# Patient Record
Sex: Female | Born: 2000 | Race: White | Hispanic: No | Marital: Single | Smoking: Never smoker
Health system: Southern US, Community
[De-identification: ages and names within clinical notes are randomized; demographics above are authoritative.]

## PROBLEM LIST (undated history)

## (undated) DIAGNOSIS — K219 Gastro-esophageal reflux disease without esophagitis: Secondary | ICD-10-CM

---
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
## 2020-01-08 ENCOUNTER — Ambulatory Visit (INDEPENDENT_AMBULATORY_CARE_PROVIDER_SITE_OTHER): Payer: 59 | Admitting: Physical Therapy

## 2020-01-08 ENCOUNTER — Other Ambulatory Visit: Payer: Self-pay

## 2020-01-08 ENCOUNTER — Encounter: Payer: Self-pay | Admitting: Physical Therapy

## 2020-01-08 DIAGNOSIS — M545 Low back pain, unspecified: Secondary | ICD-10-CM

## 2020-01-08 DIAGNOSIS — R29898 Other symptoms and signs involving the musculoskeletal system: Secondary | ICD-10-CM

## 2020-01-08 DIAGNOSIS — R293 Abnormal posture: Secondary | ICD-10-CM

## 2020-01-08 DIAGNOSIS — M6281 Muscle weakness (generalized): Secondary | ICD-10-CM

## 2020-01-08 NOTE — Therapy (Signed)
[facility] [address] [place] [address] [place], [place], [address] Phone: [phone]   Fax:  [phone]  Physical Therapy Treatment  Patient Details  Name: Laura Pineda MRN: [id number] Date of Birth: [birth year] Referring Provider (PT): Dr Benjamin Stain   Encounter Date: 01/08/2020   PT End of Session - 01/08/20 1958    Visit Number 2    Number of Visits 12    Date for PT Re-Evaluation 02/09/20    PT Start Time 1620    PT Stop Time 1700    PT Time Calculation (min) 40 min    Activity Tolerance Patient tolerated treatment well    Behavior During Therapy [facility] for tasks assessed/performed           Past Medical History:  Diagnosis Date  . GERD (gastroesophageal reflux disease)     Past Surgical History:  Procedure Laterality Date  . CHOLECYSTECTOMY  01/15/2018    There were no vitals filed for this visit.   Subjective Assessment - 01/08/20 1621    Subjective Pt reports no new changes since last visit.  She states she has beeen trying to sit up taller, and doing HEP 1x/ day.    Currently in Pain? Yes    Pain Score 5     Pain Location Back    Pain Orientation Lower    Aggravating Factors  prolonged sitting; heavy book bag    Pain Relieving Factors lying down; heat              [facility] PT Assessment - 01/08/20 0001      Assessment   Medical Diagnosis LBP     Referring Provider (PT) Dr Benjamin Stain    Onset Date/Surgical Date 11/16/19    Hand Dominance Right;Left    Next MD Visit 01/26/20    Prior Therapy none             [facility] Adult PT Treatment/Exercise - 01/08/20 0001      Self-Care   Self-Care Other Self-Care Comments    Other Self-Care Comments  Pt educated on self massage with ball to back musculature for pain relief; pt returned demo.       Lumbar Exercises: Stretches   Lower Trunk Rotation 3 reps;10 seconds    Lower Trunk Rotation Limitations and wide leg LTR.       Lumbar Exercises: Aerobic    Tread Mill 1.3-2.0 mph x 5 min       Lumbar Exercises: Standing   Row Strengthening;Both;10 reps    Theraband Level (Row) Level 3 (Green)      Lumbar Exercises: Seated   Sit to Stand 10 reps   slow eccentric stand to sit - elevated table d/t knee pain      Lumbar Exercises: Supine   Ab Set 10 reps;5 seconds    AB Set Limitations tactile / VC to engage- best response with lap press in hooklying     Bridge 10 reps;3 seconds      Lumbar Exercises: Sidelying   Other Sidelying Lumbar Exercises open book x 3 reps each side.       Lumbar Exercises: Prone   Opposite Arm/Leg Raise Right arm/Left leg;Left arm/Right leg;10 reps      Lumbar Exercises: Quadruped   Madcat/Old Horse 5 reps    Madcat/Old Horse Limitations wag the tail x 3 reps each direction.  PT Long Term Goals - 12/29/19 1320      PT LONG TERM GOAL #1   Title Improve posture and alignment with patient to demonstrate upright posture in standing and sitting    Time 6    Period Weeks    Status New    Target Date 02/09/20      PT LONG TERM GOAL #2   Title Increase trunk ROM to WNL's and painfree    Time 6    Period Weeks    Status New    Target Date 02/09/20      PT LONG TERM GOAL #3   Title Patient to report no more than 1-2/10 pain on the 0-10 pain scale with functional activities    Time 6    Period Weeks    Status New    Target Date 02/09/20      PT LONG TERM GOAL #4   Title Independent in HEP    Time 6    Period Weeks    Status New    Target Date 02/09/20      PT LONG TERM GOAL #5   Title Improve FOTO to </= 32% limitation    Time 6    Period Weeks    Status New    Target Date 02/09/20                 Plan - 01/08/20 1634    Clinical Impression Statement Mulitple cues required (tactile/VC) to engage TA properly.  No increase in LBP with exercises, and no reduction either.  Minor cues for upright posture when upright. Responds well to ext based exercises.  Pt not interested in use of estim during session (also, per pt, parent has TENS at home). Goals are ongoing.    Stability/Clinical Decision Making Stable/Uncomplicated    Rehab Potential Good    PT Frequency 2x / week    PT Duration 6 weeks    PT Treatment/Interventions ADLs/Self Care Home Management;Aquatic Therapy;Electrical Stimulation;Cryotherapy;Iontophoresis 4mg /ml Dexamethasone;Moist Heat;Ultrasound;Gait training;Stair training;Functional mobility training;Therapeutic activities;Therapeutic exercise;Balance training;Neuromuscular re-education;Patient/family education;Manual techniques;Dry needling;Taping    PT Next Visit Plan progress with ther ex - stretch pecs; improve thoracic extension; improve postural control/strength; modalities and manual work as indicated.    PT Home Exercise Plan    Recommended Other Services ( Pt has very sensitive skin; likely wouldn't tolerate kinesiology tape)    Consulted and Agree with Plan of Care Patient           Patient will benefit from skilled therapeutic intervention in order to improve the following deficits and impairments:  Decreased range of motion, Increased fascial restricitons, Pain, Decreased activity tolerance, Impaired flexibility, Improper body mechanics, Decreased mobility, Decreased strength, Postural dysfunction  Visit Diagnosis: Acute bilateral low back pain without sciatica  Abnormal posture  Muscle weakness (generalized)  Other symptoms and signs involving the musculoskeletal system     Problem List Patient Active Problem List   Diagnosis Date Noted  . Lumbarization, vertebra 12/12/2019  . Acute bilateral low back pain without sciatica 12/12/2019  . 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 Allergic reaction 08/13/2019  . Allergic urticaria 08/13/2019  . Chronic rhinitis 08/13/2019  . Breast hypertrophy in female 07/06/2019  . Insomnia due to other mental disorder 02/09/2019  . Irritable bowel syndrome  with both constipation and diarrhea 02/09/2019  . Anxiety 02/09/2019  . Weight gain 12/02/2018  . Mood swings 12/02/2018  . Post concussion syndrome 12/02/2018  . Pancreatitis, gallstone 01/14/2018  . Social anxiety  disorder 07/24/2017  . Multiple food allergies 05/28/2017  . Abdominal cramping 05/24/2017  . Bruising 05/24/2017  . Anhedonia 05/24/2017  . Moderate episode of recurrent major depressive disorder (HCC) 05/24/2017   Mayer Camel, PTA 01/08/20 6:00PM  [facility] [address] [place] [address] [place], [place], [address] Phone: [phone]   Fax:  [phone]  Name: Keidy Thurgood MRN: [id number] Date of Birth: 2000/04/05

## 2020-01-14 ENCOUNTER — Encounter: Payer: 59 | Admitting: Physical Therapy

## 2020-01-19 ENCOUNTER — Other Ambulatory Visit: Payer: Self-pay

## 2020-01-19 ENCOUNTER — Ambulatory Visit (INDEPENDENT_AMBULATORY_CARE_PROVIDER_SITE_OTHER): Payer: 59 | Admitting: Physical Therapy

## 2020-01-19 DIAGNOSIS — M6281 Muscle weakness (generalized): Secondary | ICD-10-CM

## 2020-01-19 DIAGNOSIS — M545 Low back pain, unspecified: Secondary | ICD-10-CM | POA: Diagnosis not present

## 2020-01-19 DIAGNOSIS — R293 Abnormal posture: Secondary | ICD-10-CM

## 2020-01-19 NOTE — Therapy (Signed)
[facility] [place] [address] [place] [address] [place], [place], [address] Phone: [phone]   Fax:  [phone]  Physical Therapy Treatment  Patient Details  Name: Laura Pineda MRN: [id number] Date of Birth: 2001-02-06 Referring Provider (PT): Dr Benjamin Stain   Encounter Date: 01/19/2020   PT End of Session - 01/19/20 1608    Visit Number 3    Number of Visits 12    Date for PT Re-Evaluation 02/09/20    PT Start Time 1604    PT Stop Time 1652    PT Time Calculation (min) 48 min    Activity Tolerance Patient tolerated treatment well    Behavior During Therapy [facility] for tasks assessed/performed           Past Medical History:  Diagnosis Date  . GERD (gastroesophageal reflux disease)     Past Surgical History:  Procedure Laterality Date  . CHOLECYSTECTOMY  01/15/2018    There were no vitals filed for this visit.   Subjective Assessment - 01/19/20 1608    Subjective Pt reports her back pain is sporadic; up to 7/10. She states she is doing her exercises 1x/day.  She reports that standing trunk ext after sitting, open book in sidelying, and squats all help her back.  She takes a meloxicam if pain doesn't ease up (taken twice in the last week).    Currently in Pain? No/denies    Pain Score 0-No pain              [facility] PT Assessment - 01/19/20 0001      Assessment   Medical Diagnosis LBP     Referring Provider (PT) Dr Benjamin Stain    Onset Date/Surgical Date 11/16/19    Hand Dominance Right;Left    Next MD Visit 01/26/20    Prior Therapy none       AROM   Lumbar - Right Side Bend WNL    Lumbar - Left Side Bend WNL    Lumbar - Right Rotation 50%    Lumbar - Left Rotation WNL            [facility] Adult PT Treatment/Exercise - 01/19/20 0001      Self-Care   Self-Care Posture    Posture Pt and pt's mother educated on importance of upright posture and avoiding prolonged flexed positions (ie: looking down at phone, or slumped  in school desk). Both verbalized understanding.       Lumbar Exercises: Aerobic   Tread Mill 2.0-2.3 mph x 5 min for warm up.       Lumbar Exercises: Standing   Row Strengthening;Both;10 reps    Theraband Level (Row) Level 3 (Green)    Row Limitations cues for form and to slow speed.     Other Standing Lumbar Exercises high plank to downward dog (modified with hands on elevated table) x 10 sec each pose x 3 reps; frequent cues on form of plank (hips too low, shoulders hiked).        Lumbar Exercises: Seated   Sit to Stand 5 reps   to black mat; good eccentric control     Lumbar Exercises: Supine   Single Leg Bridge 5 reps   2 sets, fig 4 position     Lumbar Exercises: Sidelying   Other Sidelying Lumbar Exercises open book x 2 reps each side; 5 reps with yellow band       Lumbar Exercises: Prone   Opposite Arm/Leg Raise Right arm/Left leg;Left arm/Right leg;10 reps  Other Prone Lumbar Exercises pelvic press x 5 sec x 3, then pelvic press with unilateral knee bends       Lumbar Exercises: Quadruped   Madcat/Old Horse 5 reps    Madcat/Old Horse Limitations wag the tail x 3 reps each direction.                        PT Long Term Goals - 12/29/19 1320      PT LONG TERM GOAL #1   Title Improve posture and alignment with patient to demonstrate upright posture in standing and sitting    Time 6    Period Weeks    Status New    Target Date 02/09/20      PT LONG TERM GOAL #2   Title Increase trunk ROM to WNL's and painfree    Time 6    Period Weeks    Status New    Target Date 02/09/20      PT LONG TERM GOAL #3   Title Patient to report no more than 1-2/10 pain on the 0-10 pain scale with functional activities    Time 6    Period Weeks    Status New    Target Date 02/09/20      PT LONG TERM GOAL #4   Title Independent in HEP    Time 6    Period Weeks    Status New    Target Date 02/09/20      PT LONG TERM GOAL #5   Title Improve FOTO to </= 32%  limitation    Time 6    Period Weeks    Status New    Target Date 02/09/20                 Plan - 01/19/20 1701    Clinical Impression Statement Pt reporting less frequency of back pain.  Pt required less frequent cues on upright posture, compared to last session.  Pt able to tolerate single leg bridge without pain or difficulty.  All exercises tolerated well.  Progressing towards goals.    Stability/Clinical Decision Making Stable/Uncomplicated    Rehab Potential Good    PT Frequency 2x / week    PT Duration 6 weeks    PT Treatment/Interventions ADLs/Self Care Home Management;Aquatic Therapy;Electrical Stimulation;Cryotherapy;Iontophoresis 4mg /ml Dexamethasone;Moist Heat;Ultrasound;Gait training;Stair training;Functional mobility training;Therapeutic activities;Therapeutic exercise;Balance training;Neuromuscular re-education;Patient/family education;Manual techniques;Dry needling;Taping    PT Next Visit Plan improve thoracic extension; improve postural control/strength; trial squat with kettle bell and core engaged.    PT Home Exercise Plan    Consulted and Agree with Plan of Care Patient           Patient will benefit from skilled therapeutic intervention in order to improve the following deficits and impairments:  Decreased range of motion, Increased fascial restricitons, Pain, Decreased activity tolerance, Impaired flexibility, Improper body mechanics, Decreased mobility, Decreased strength, Postural dysfunction  Visit Diagnosis: Acute bilateral low back pain without sciatica  Abnormal posture  Muscle weakness (generalized)     Problem List Patient Active Problem List   Diagnosis Date Noted  . Lumbarization, vertebra 12/12/2019  . Acute bilateral low back pain without sciatica 12/12/2019  . 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 Allergic reaction 08/13/2019  . Allergic urticaria 08/13/2019  . Chronic rhinitis 08/13/2019  . Breast hypertrophy in  female 07/06/2019  . Insomnia due to other mental disorder 02/09/2019  . Irritable bowel syndrome with both constipation and diarrhea 02/09/2019  .  Anxiety 02/09/2019  . Weight gain 12/02/2018  . Mood swings 12/02/2018  . Post concussion syndrome 12/02/2018  . Pancreatitis, gallstone 01/14/2018  . Social anxiety disorder 07/24/2017  . Multiple food allergies 05/28/2017  . Abdominal cramping 05/24/2017  . Bruising 05/24/2017  . Anhedonia 05/24/2017  . Moderate episode of recurrent major depressive disorder (HCC) 05/24/2017    Salvadore Oxford 01/19/2020, 5:03 PM  [facility] [address] [place] [address] [place], [place], [address] Phone: [phone]   Fax:  [phone]  Name: Laura Pineda MRN: [id number] Date of Birth: Jan 11, 2001

## 2020-01-22 ENCOUNTER — Ambulatory Visit (INDEPENDENT_AMBULATORY_CARE_PROVIDER_SITE_OTHER): Payer: 59 | Admitting: Rehabilitative and Restorative Service Providers"

## 2020-01-22 ENCOUNTER — Encounter: Payer: Self-pay | Admitting: Rehabilitative and Restorative Service Providers"

## 2020-01-22 ENCOUNTER — Other Ambulatory Visit: Payer: Self-pay

## 2020-01-22 DIAGNOSIS — M6281 Muscle weakness (generalized): Secondary | ICD-10-CM

## 2020-01-22 DIAGNOSIS — R29898 Other symptoms and signs involving the musculoskeletal system: Secondary | ICD-10-CM

## 2020-01-22 DIAGNOSIS — R293 Abnormal posture: Secondary | ICD-10-CM

## 2020-01-22 DIAGNOSIS — M545 Low back pain, unspecified: Secondary | ICD-10-CM | POA: Diagnosis not present

## 2020-01-22 NOTE — Patient Instructions (Signed)
Access Code: [email or web address]: [email or web address]: 11/11/2021Prepared by: Najat Olazabal HoltExercises  Supine Transversus Abdominis Bracing with Pelvic Floor Contraction - 2 x daily - 7 x weekly - 1 sets - 10 reps - 10sec hold  Bridge - 2 x daily - 7 x weekly - 1-2 sets - 10 reps - 5 sec hold  Hooklying Isometric Clamshell - 2 x daily - 7 x weekly - 1 sets - 10 reps - 3 sec hold  Sit to Stand - 2 x daily - 7 x weekly - 1 sets - 10 reps - 3-5 sec hold  Wall Quarter Squat - 2 x daily - 7 x weekly - 1-2 sets - 10 reps - 5-10 sec hold  Sidelying Thoracic Rotation with Open Book - 1 x daily - 7 x weekly - 1 sets - 3-5 reps  Prone Alternating Arm and Leg Lifts - 1 x daily - 7 x weekly - 1 sets - 10 reps  Cat-Camel to Child's Pose - 1 x daily - 7 x weekly - 1 sets - 5 reps  Shoulder Extension with Resistance - 2 x daily - 7 x weekly - 1-3 sets - 10 reps - 2-3 sec hold  Standing Plank on Wall - 2 x daily - 7 x weekly - 1 sets - 3 reps - 60 sec hold  Quadruped Alternating Arm Lift - 2 x daily - 7 x weekly - 1 sets - 10 reps - 3-5 sec hold  Quadruped Alternating Leg Extensions - 2 x daily - 7 x weekly - 1 sets - 10 reps - 3-5 sec hold

## 2020-01-22 NOTE — Therapy (Signed)
[facility] [place] [address] [place], [place], [address] Phone: [phone]   Fax:  [phone]  Physical Therapy Treatment  Patient Details  Name: Laura Pineda MRN: [id number] Date of Birth: 13-May-2000 Referring Provider (PT): Dr Benjamin Stain   Encounter Date: 01/22/2020   PT End of Session - 01/22/20 1614    Visit Number 4    Number of Visits 12    Date for PT Re-Evaluation 02/09/20    PT Start Time 1614    PT Stop Time 1659    PT Time Calculation (min) 45 min    Activity Tolerance Patient tolerated treatment well           Past Medical History:  Diagnosis Date  . GERD (gastroesophageal reflux disease)     Past Surgical History:  Procedure Laterality Date  . CHOLECYSTECTOMY  01/15/2018    There were no vitals filed for this visit.   Subjective Assessment - 01/22/20 1615    Subjective Patient reports increased back pain today. Did not have school so she has "just been chillin'" did a few of her exercises    Currently in Pain? Yes    Pain Score 5     Pain Location Back    Pain Orientation Lower    Pain Descriptors / Indicators Sharp    Pain Type Acute pain    Pain Radiating Towards up to mid back in the middle - no symptoms in LE's    Pain Frequency Intermittent    Aggravating Factors  prolonged sitting; heavy book bag    Pain Relieving Factors lying down; heat; exercise                             [facility] Adult PT Treatment/Exercise - 01/22/20 0001      Lumbar Exercises: Stretches   Other Lumbar Stretch Exercise thoracic extension lying supine with coregeous ball mid thoracic area x 2 min       Lumbar Exercises: Aerobic   Elliptical L 1.5 x 5 min end of session     Tread Mill 2.0-2.4 mph x 5 min for warm up      Lumbar Exercises: Standing   Row Strengthening;Both;10 reps   VC for posture    Theraband Level (Row) Level 3 (Green)    Row Limitations repeated holding shoulder tight and  stepping back 10 reps 3 sec hold     Shoulder Extension Strengthening;Both;10 reps;Theraband    Theraband Level (Shoulder Extension) Level 3 (Green)    Shoulder Extension Limitations bow and arrow green TB x 10 each UE     Other Standing Lumbar Exercises wall plank 60 sec x 3 reps       Lumbar Exercises: Seated   Sit to Stand 10 reps   to black mat; better eccentric control    Sit to Stand Limitations repeated with 5# KB x 10 reps       Lumbar Exercises: Sidelying   Other Sidelying Lumbar Exercises open book x10 reps each side - yellow band       Lumbar Exercises: Prone   Opposite Arm/Leg Raise Right arm/Left leg;Left arm/Right leg;10 reps      Lumbar Exercises: Quadruped   Madcat/Old Horse 5 reps    Single Arm Raise Right;Left;10 reps;2 seconds   alternating UE's    Straight Leg Raise 10 reps;2 seconds   alternating LE's  PT Education - 01/22/20 1701    Education Details HEP    Person(s) Educated Patient    Methods Explanation;Demonstration;Tactile cues;Verbal cues;Handout    Comprehension Verbalized understanding;Returned demonstration;Verbal cues required;Tactile cues required               PT Long Term Goals - 12/29/19 1320      PT LONG TERM GOAL #1   Title Improve posture and alignment with patient to demonstrate upright posture in standing and sitting    Time 6    Period Weeks    Status New    Target Date 02/09/20      PT LONG TERM GOAL #2   Title Increase trunk ROM to WNL's and painfree    Time 6    Period Weeks    Status New    Target Date 02/09/20      PT LONG TERM GOAL #3   Title Patient to report no more than 1-2/10 pain on the 0-10 pain scale with functional activities    Time 6    Period Weeks    Status New    Target Date 02/09/20      PT LONG TERM GOAL #4   Title Independent in HEP    Time 6    Period Weeks    Status New    Target Date 02/09/20      PT LONG TERM GOAL #5   Title Improve FOTO to </= 32% limitation     Time 6    Period Weeks    Status New    Target Date 02/09/20                 Plan - 01/22/20 1620    Clinical Impression Statement --           Patient will benefit from skilled therapeutic intervention in order to improve the following deficits and impairments:     Visit Diagnosis: Acute bilateral low back pain without sciatica  Abnormal posture  Muscle weakness (generalized)  Other symptoms and signs involving the musculoskeletal system     Problem List Patient Active Problem List   Diagnosis Date Noted  . Lumbarization, vertebra 12/12/2019  . Acute bilateral low back pain without sciatica 12/12/2019  . 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 Allergic reaction 08/13/2019  . Allergic urticaria 08/13/2019  . Chronic rhinitis 08/13/2019  . Breast hypertrophy in female 07/06/2019  . Insomnia due to other mental disorder 02/09/2019  . Irritable bowel syndrome with both constipation and diarrhea 02/09/2019  . Anxiety 02/09/2019  . Weight gain 12/02/2018  . Mood swings 12/02/2018  . Post concussion syndrome 12/02/2018  . Pancreatitis, gallstone 01/14/2018  . Social anxiety disorder 07/24/2017  . Multiple food allergies 05/28/2017  . Abdominal cramping 05/24/2017  . Bruising 05/24/2017  . Anhedonia 05/24/2017  . Moderate episode of recurrent major depressive disorder (HCC) 05/24/2017    Caci Orren Rober Minion PT, MPH  01/22/2020, 5:02 PM  [facility] [address] [place] [place], [place], [address] Phone: [phone]   Fax:  [phone]  Name: Laura Pineda MRN: [id number] Date of Birth: 02/12/2001

## 2020-01-26 ENCOUNTER — Ambulatory Visit: Payer: 59 | Admitting: Sports Medicine

## 2020-01-26 ENCOUNTER — Ambulatory Visit (INDEPENDENT_AMBULATORY_CARE_PROVIDER_SITE_OTHER): Payer: 59 | Admitting: Physical Therapy

## 2020-01-26 ENCOUNTER — Other Ambulatory Visit: Payer: Self-pay

## 2020-01-26 DIAGNOSIS — R293 Abnormal posture: Secondary | ICD-10-CM | POA: Diagnosis not present

## 2020-01-26 DIAGNOSIS — M6281 Muscle weakness (generalized): Secondary | ICD-10-CM

## 2020-01-26 DIAGNOSIS — M545 Low back pain, unspecified: Secondary | ICD-10-CM | POA: Diagnosis not present

## 2020-01-26 DIAGNOSIS — R29898 Other symptoms and signs involving the musculoskeletal system: Secondary | ICD-10-CM | POA: Diagnosis not present

## 2020-01-26 NOTE — Patient Instructions (Signed)

## 2020-01-26 NOTE — Therapy (Signed)
[place], [place], [address] Phone: [phone]   Fax:  [phone]  Physical Therapy Treatment  Patient Details  Name: Laura Pineda MRN: [id number] Date of Birth: [birth year] Referring Provider (PT): Dr Dianah Field   Encounter Date: 01/26/2020   PT End of Session - 01/26/20 1659    Visit Number 5    Number of Visits 12    Date for PT Re-Evaluation 02/09/20    PT Start Time 8768    PT Stop Time 1655    PT Time Calculation (min) 48 min    Activity Tolerance Patient tolerated treatment well    Behavior During Therapy [facility] for tasks assessed/performed           Past Medical History:  Diagnosis Date  . GERD (gastroesophageal reflux disease)     Past Surgical History:  Procedure Laterality Date  . CHOLECYSTECTOMY  01/15/2018    There were no vitals filed for this visit.   Subjective Assessment - 01/26/20 1607    Subjective Pt reports she went hiking Friday, then slept all weekend.  "Today is a bad day, everything is hurting".  "I'm very sluggish today".  Pt reports she feels she is sitting up straighter.  She also notes that she tried to pick something up off floor with weekend, and it really hurt her back (pt demonstrates bending over to touch floor with straight hips / knees)"    Currently in Pain? Yes    Pain Score 5     Pain Location Generalized    Pain Descriptors / Indicators Sore    Aggravating Factors  prolonged sitting    Pain Relieving Factors lying down, heat, after completing exercises.              [facility] PT Assessment - 01/26/20 0001      Assessment   Medical Diagnosis LBP     Referring Provider (PT) Dr Dianah Field    Onset Date/Surgical Date 11/16/19    Hand Dominance Right;Left    Next MD Visit 02/16/20    Prior Therapy none             [facility] Adult PT Treatment/Exercise - 01/26/20 0001      Lumbar Exercises: Stretches   Quad Stretch Right;Left;2 reps;30  seconds   prone with strap     Lumbar Exercises: Aerobic   Elliptical L1: 3.5 min for warm up.     Tread Mill 2.4-2.6 mph x 3 min - for continued warm up       Lumbar Exercises: Standing   Row Strengthening;Both;10 reps   VC for posture    Theraband Level (Row) Level 3 (Green)   3 sec hold in retraction    Shoulder Extension Strengthening;Both;10 reps;Theraband    Theraband Level (Shoulder Extension) Level 3 (Green)    Shoulder Extension Limitations bow and arrow green TB x 10 each UE     Other Standing Lumbar Exercises functional squat lifting 5# KB to 6" step x 10     Other Standing Lumbar Exercises wall plank 60 sec x 2 reps followed by reverse wall push up x 8 reps.       Lumbar Exercises: Seated   Sit to Stand 5 reps   to black mat; eccentric control      Lumbar Exercises: Sidelying   Other Sidelying Lumbar Exercises open book x 5 reps each side - green band       Lumbar Exercises: Prone   Opposite  Arm/Leg Raise Right arm/Left leg;Left arm/Right leg;10 reps      Lumbar Exercises: Quadruped   Madcat/Old Horse 5 reps                       PT Long Term Goals - 01/26/20 1625      PT LONG TERM GOAL #1   Title Improve posture and alignment with patient to demonstrate upright posture in standing and sitting    Time 6    Status On-going      PT LONG TERM GOAL #2   Title Increase trunk ROM to WNL's and painfree    Time 6    Period Weeks    Status Partially Met      PT LONG TERM GOAL #3   Title Patient to report no more than 1-2/10 pain on the 0-10 pain scale with functional activities    Time 6    Period Weeks    Status On-going      PT LONG TERM GOAL #4   Title Independent in HEP    Time 6    Period Weeks    Status On-going      PT LONG TERM GOAL #5   Title Improve FOTO to </= 32% limitation    Time 6    Period Weeks    Status On-going                 Plan - 01/26/20 1641    Clinical Impression Statement Pt reported decrease of pain from  5 to 3/10 at end of session. Improving endurance and eccentric control with squats. Encouraged use of good body mechanics with reaching to pick stuff up off floor (ie: with squat vs bending over with straight legs). Pt required some encouragement throughout session; also given encouragement for consistent completion of HEP at home to assist with reduction of pain and improvement in strength. Progressing gradually towards goals.    Rehab Potential Good    PT Frequency 2x / week    PT Duration 6 weeks    PT Treatment/Interventions ADLs/Self Care Home Management;Aquatic Therapy;Electrical Stimulation;Cryotherapy;Iontophoresis 66m/ml Dexamethasone;Moist Heat;Ultrasound;Gait training;Stair training;Functional mobility training;Therapeutic activities;Therapeutic exercise;Balance training;Neuromuscular re-education;Patient/family education;Manual techniques;Dry needling;Taping    PT Next Visit Plan improve thoracic extension; improve postural control/strength; trial of quadruped exercises over swiss ball to decreased pressure on knees    PT Home Exercise Plan [email or web address]   Consulted and Agree with Plan of Care Patient;Family member/caregiver    Family Member Consulted mom           Patient will benefit from skilled therapeutic intervention in order to improve the following deficits and impairments:  Decreased range of motion, Increased fascial restricitons, Pain, Decreased activity tolerance, Impaired flexibility, Improper body mechanics, Decreased mobility, Decreased strength, Postural dysfunction  Visit Diagnosis: Acute bilateral low back pain without sciatica  Abnormal posture  Muscle weakness (generalized)  Other symptoms and signs involving the musculoskeletal system     Problem List Patient Active Problem List   Diagnosis Date Noted  . Lumbarization, vertebra 12/12/2019  . Acute bilateral low back pain without sciatica 12/12/2019  . 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 Allergic  reaction 08/13/2019  . Allergic urticaria 08/13/2019  . Chronic rhinitis 08/13/2019  . Breast hypertrophy in female 07/06/2019  . Insomnia due to other mental disorder 02/09/2019  . Irritable bowel syndrome with both constipation and diarrhea 02/09/2019  . Anxiety 02/09/2019  . Weight gain 12/02/2018  . Mood swings 12/02/2018  .  Post concussion syndrome 12/02/2018  . Pancreatitis, gallstone 01/14/2018  . Social anxiety disorder 07/24/2017  . Multiple food allergies 05/28/2017  . Abdominal cramping 05/24/2017  . Bruising 05/24/2017  . Anhedonia 05/24/2017  . Moderate episode of recurrent major depressive disorder ([place]) 05/24/2017   Kerin Perna, PTA 01/26/20 [place] [facility] [place], [place], [address] Phone: [phone]   Fax:  [phone]  Name: Laura Pineda MRN: [id number] Date of Birth: 06-09-2000

## 2020-01-28 ENCOUNTER — Ambulatory Visit (INDEPENDENT_AMBULATORY_CARE_PROVIDER_SITE_OTHER): Payer: 59 | Admitting: Physical Therapy

## 2020-01-28 ENCOUNTER — Other Ambulatory Visit: Payer: Self-pay

## 2020-01-28 ENCOUNTER — Encounter: Payer: Self-pay | Admitting: Physical Therapy

## 2020-01-28 DIAGNOSIS — R293 Abnormal posture: Secondary | ICD-10-CM | POA: Diagnosis not present

## 2020-01-28 DIAGNOSIS — M6281 Muscle weakness (generalized): Secondary | ICD-10-CM | POA: Diagnosis not present

## 2020-01-28 DIAGNOSIS — M545 Low back pain, unspecified: Secondary | ICD-10-CM

## 2020-01-28 DIAGNOSIS — R29898 Other symptoms and signs involving the musculoskeletal system: Secondary | ICD-10-CM | POA: Diagnosis not present

## 2020-01-28 NOTE — Therapy (Signed)
[place], [place], [address] Phone: [phone]   Fax:  [phone]  Physical Therapy Treatment  Patient Details  Name: Laura Pineda MRN: [id number] Date of Birth: Sep 18, 2000 Referring Provider (PT): Dr Dianah Field   Encounter Date: 01/28/2020   PT End of Session - 01/28/20 1602    Number of Visits 12    Date for PT Re-Evaluation 02/09/20    PT Start Time 1520    PT Stop Time 1601    PT Time Calculation (min) 41 min    Activity Tolerance Patient tolerated treatment well    Behavior During Therapy [facility] for tasks assessed/performed           Past Medical History:  Diagnosis Date  . GERD (gastroesophageal reflux disease)     Past Surgical History:  Procedure Laterality Date  . CHOLECYSTECTOMY  01/15/2018    There were no vitals filed for this visit.   Subjective Assessment - 01/28/20 1525    Subjective Pt reports she had less pain yesterday.  However today in school, her LLE fell asleep in 3rd period. She reports she was able to relieve numbness by propping RLE under her Lt thigh.  "There's no pain in my leg, it just goes numb"    Currently in Pain? Yes    Pain Score 5     Pain Location Back    Pain Orientation Right;Left;Lower    Pain Descriptors / Indicators Sharp    Aggravating Factors  sitting in school, walking with bookbag    Pain Relieving Factors lying down, heat              [facility] PT Assessment - 01/28/20 0001      Assessment   Medical Diagnosis LBP     Referring Provider (PT) Dr Dianah Field    Onset Date/Surgical Date 11/16/19    Hand Dominance Right;Left    Next MD Visit 02/16/20    Prior Therapy none              [facility] Adult PT Treatment/Exercise - 01/28/20 0001      Lumbar Exercises: Stretches   Lower Trunk Rotation 3 reps   rocking back and forth, 3-5 sec hold each way.     Lumbar Exercises: Aerobic   Tread Mill 2.5 mph x 5 min       Lumbar  Exercises: Seated   Sit to Stand 5 reps   to black mat; eccentric control      Lumbar Exercises: Supine   Bent Knee Raise 10 reps   with TA engaged.    Dead Bug 10 reps;3 seconds   with core engaged.    Bridge 10 reps;4 seconds      Lumbar Exercises: Sidelying   Other Sidelying Lumbar Exercises modified side plank x 10 sec each side.       Lumbar Exercises: Prone   Opposite Arm/Leg Raise Right arm/Left leg;Left arm/Right leg;10 reps      Lumbar Exercises: Quadruped   Madcat/Old Horse 5 reps    Other Quadruped Lumbar Exercises primal push up x 5 sec, 2 reps (challenging, reports crunching in knees when lifting);  high plank x 10 sec        Modalities   Modalities --   deferred; will use at home.             PT Long Term Goals - 01/28/20 1536      PT LONG TERM GOAL #1   Title  Improve posture and alignment with patient to demonstrate upright posture in standing and sitting    Time 6    Status Partially Met      PT LONG TERM GOAL #2   Title Increase trunk ROM to WNL's and painfree    Time 6    Period Weeks    Status Partially Met      PT LONG TERM GOAL #3   Title Patient to report no more than 1-2/10 pain on the 0-10 pain scale with functional activities    Time 6    Period Weeks    Status On-going      PT LONG TERM GOAL #4   Title Independent in HEP    Time 6    Period Weeks    Status On-going      PT LONG TERM GOAL #5   Title Improve FOTO to </= 32% limitation    Time 6    Period Weeks    Status On-going                 Plan - 01/28/20 1536    Clinical Impression Statement Pt reporting occurance of numbness in entire LLE during school day; encouraged pt to get up from chair and stand every 30 min (with permission of teacher).  Pt was painfree when in hooklying position and reported reduction of pain in standing at end of session. Continued focus on spinal stabilization and core strengthening to progress towards LTGs.    Rehab Potential Good    PT  Frequency 2x / week    PT Duration 6 weeks    PT Treatment/Interventions ADLs/Self Care Home Management;Aquatic Therapy;Electrical Stimulation;Cryotherapy;Iontophoresis 66m/ml Dexamethasone;Moist Heat;Ultrasound;Gait training;Stair training;Functional mobility training;Therapeutic activities;Therapeutic exercise;Balance training;Neuromuscular re-education;Patient/family education;Manual techniques;Dry needling;Taping    PT Next Visit Plan improve thoracic extension; improve postural control/strength.    PT Home Exercise Plan [email or web address]          Patient will benefit from skilled therapeutic intervention in order to improve the following deficits and impairments:  Decreased range of motion, Increased fascial restricitons, Pain, Decreased activity tolerance, Impaired flexibility, Improper body mechanics, Decreased mobility, Decreased strength, Postural dysfunction  Visit Diagnosis: Acute bilateral low back pain without sciatica  Abnormal posture  Muscle weakness (generalized)  Other symptoms and signs involving the musculoskeletal system     Problem List Patient Active Problem List   Diagnosis Date Noted  . Lumbarization, vertebra 12/12/2019  . Acute bilateral low back pain without sciatica 12/12/2019  . 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 Allergic reaction 08/13/2019  . Allergic urticaria 08/13/2019  . Chronic rhinitis 08/13/2019  . Breast hypertrophy in female 07/06/2019  . Insomnia due to other mental disorder 02/09/2019  . Irritable bowel syndrome with both constipation and diarrhea 02/09/2019  . Anxiety 02/09/2019  . Weight gain 12/02/2018  . Mood swings 12/02/2018  . Post concussion syndrome 12/02/2018  . Pancreatitis, gallstone 01/14/2018  . Social anxiety disorder 07/24/2017  . Multiple food allergies 05/28/2017  . Abdominal cramping 05/24/2017  . Bruising 05/24/2017  . Anhedonia 05/24/2017  . Moderate episode of recurrent major depressive disorder ([place]  05/24/2017   JKerin Perna PTA 01/28/20 4:05 PM  [facility] [place] [address]Phone: [phone]  Fax:  [phone] Name: Laura SeidenbergMRN: [id number] of Birth: 112/18/2002

## 2020-02-02 ENCOUNTER — Encounter: Payer: 59 | Admitting: Physical Therapy

## 2020-02-11 ENCOUNTER — Ambulatory Visit (INDEPENDENT_AMBULATORY_CARE_PROVIDER_SITE_OTHER): Payer: 59 | Admitting: Physical Therapy

## 2020-02-11 ENCOUNTER — Other Ambulatory Visit: Payer: Self-pay

## 2020-02-11 DIAGNOSIS — M545 Low back pain, unspecified: Secondary | ICD-10-CM | POA: Diagnosis not present

## 2020-02-11 DIAGNOSIS — M6281 Muscle weakness (generalized): Secondary | ICD-10-CM | POA: Diagnosis not present

## 2020-02-11 DIAGNOSIS — R293 Abnormal posture: Secondary | ICD-10-CM

## 2020-02-11 DIAGNOSIS — R29898 Other symptoms and signs involving the musculoskeletal system: Secondary | ICD-10-CM | POA: Diagnosis not present

## 2020-02-11 NOTE — Patient Instructions (Addendum)
Access Code: [email or web address] URL: [email or web address] Date: 12/01/2021Prepared by: [facility] [place]Exercises  Sit to Stand - 2 x daily - 7 x weekly - 1 sets - 10 reps - 3-5 sec hold  Prone Alternating Arm and Leg Lifts - 1 x daily - 7 x weekly - 1 sets - 10 reps  Cat-Camel to Child's Pose - 1 x daily - 7 x weekly - 1 sets - 5 reps  Sidelying Thoracic Rotation with Open Book - 1 x daily - 7 x weekly - 1 sets - 3-5 reps  Standing Shoulder Row with Anchored Resistance - 1 x daily - 7 x weekly - 2 sets - 10 reps - 3 sec hold  Shoulder Extension with Resistance - 1 x daily - 7 x weekly - 1 sets - 10 reps - 2-3 sec hold  Anti-Rotation Lateral Stepping with Press - 1 x daily - 3 x weekly - 1 sets - 5-10 reps  Figure 4 Bridge - 1 x daily - 7 x weekly - 1 sets - 10 reps

## 2020-02-11 NOTE — Addendum Note (Signed)
Addended by: Margretta Ditty M on: 02/11/2020 11:20 PM   Modules accepted: Orders

## 2020-02-11 NOTE — Therapy (Addendum)
[place], [place], [address] Phone: [phone]   Fax:  [phone]  Physical Therapy Treatment/ [facility] Note/ Discharge Summary  Patient Details  Name: Laura Pineda MRN: [id number] Date of Birth: 2000-12-01 Referring Provider (PT): Dr Dianah Field   Encounter Date: 02/11/2020   PT End of Session - 02/11/20 1614    Visit Number 7    Number of Visits 12    Date for PT Re-Evaluation 02/09/20    PT Start Time 1601    PT Stop Time 1650    PT Time Calculation (min) 49 min    Activity Tolerance Patient tolerated treatment well    Behavior During Therapy [facility] for tasks assessed/performed           Past Medical History:  Diagnosis Date  . GERD (gastroesophageal reflux disease)     Past Surgical History:  Procedure Laterality Date  . CHOLECYSTECTOMY  01/15/2018    There were no vitals filed for this visit.   Subjective Assessment - 02/11/20 1615    Subjective Pt reports her knees have been bothering her the last few days.  She states that PT has "helped a little" with her LBP.  She is performing 4 exercises of HEP every other day. She is limited from doing chores due like dishes due to LBP with prolonged forward flexion.   She is taking meloxicam when needed (when pain is 5/10).  Her mother states that she will be limited with her ability to take daughter to appts after today.    Currently in Pain? Yes    Pain Score 3     Pain Location Back    Pain Orientation Right;Left;Lower    Pain Descriptors / Indicators Dull;Aching    Aggravating Factors  walking with bookbag, sitting at school, leaning over sink for dishes    Pain Relieving Factors lying down, heat              [facility] PT Assessment - 02/11/20 0001      Assessment   Medical Diagnosis LBP     Referring Provider (PT) Dr Dianah Field    Onset Date/Surgical Date 11/16/19    Hand Dominance Right;Left    Next MD Visit 02/16/20    Prior  Therapy none       Observation/Other Assessments   Focus on Therapeutic Outcomes (FOTO)  30% limited      AROM   Lumbar Flexion WNL    Lumbar Extension WNL    Lumbar - Right Side Bend WNL    Lumbar - Left Side Bend WNL    Lumbar - Right Rotation WNL   tightness reported   Lumbar - Left Rotation WNL            [facility] Adult PT Treatment/Exercise - 02/11/20 0001      Lumbar Exercises: Stretches   Other Lumbar Stretch Exercise open book x 3 reps each side     Other Lumbar Stretch Exercise midlevel doorway stretch x 20 sec x 2 reps      Lumbar Exercises: Aerobic   Tread Mill 2.8 mph x 5 min       Lumbar Exercises: Standing   Row Strengthening;Both;10 reps   3 sec hold in retraction   Shoulder Extension Strengthening;Both;5 reps    Theraband Level (Shoulder Extension) Level 4 (Blue)    Other Standing Lumbar Exercises anti-rotation with side step (blue band) x 5 reps each side.     Other Standing Lumbar Exercises  wall plank x 30 sec (cues for form), 5 wall push ups.   reciprocal steps (no UE support) 6" x 12       Lumbar Exercises: Seated   Sit to Stand 10 reps   to regular table, eccentric lowering     Lumbar Exercises: Prone   Opposite Arm/Leg Raise Right arm/Left leg;Left arm/Right leg;10 reps      Lumbar Exercises: Quadruped   Madcat/Old Horse 5 reps                  PT Education - 02/11/20 1653    Education Details HEP    Person(s) Educated Patient;Parent(s)    Methods Explanation;Handout;Demonstration;Verbal cues    Comprehension Verbalized understanding;Returned demonstration               PT Long Term Goals - 02/11/20 1630      PT LONG TERM GOAL #1   Title Improve posture and alignment with patient to demonstrate upright posture in standing and sitting    Baseline minor cues required, but much improved.    Time 6    Status Partially Met      PT LONG TERM GOAL #2   Title Increase trunk ROM to WNL's and painfree    Time 6    Period Weeks     Status Achieved      PT LONG TERM GOAL #3   Title Patient to report no more than 1-2/10 pain on the 0-10 pain scale with functional activities    Baseline Pain up to 5/10, 2/10 at lowest.    Time 6    Period Weeks    Status Not Met      PT LONG TERM GOAL #4   Title Independent in HEP    Time 6    Period Weeks    Status Partially Met      PT LONG TERM GOAL #5   Title Improve FOTO to </= 32% limitation    Time 6    Period Weeks    Status Achieved                  Plan - 02/11/20 1637    Clinical Impression Statement Good improvement in posture, with less cues during session.  She tolerated increased speed on treadmill and increased resistance with core strengthening exercises, without increase in LBP.  Her lumbar ROM has improved and is painfree; some tightness reported with Rt rotation. Pt has partially met her goals.   In speaking with pt and her mother, they would like to hold therapy until MD appt.  Limited ability to attend sessions in future due to transportation and upcoming school graduation.    Rehab Potential Good    PT Frequency 2x / week    PT Duration 6 weeks    PT Treatment/Interventions ADLs/Self Care Home Management;Aquatic Therapy;Electrical Stimulation;Cryotherapy;Iontophoresis 4mg /ml Dexamethasone;Moist Heat;Ultrasound;Gait training;Stair training;Functional mobility training;Therapeutic activities;Therapeutic exercise;Balance training;Neuromuscular re-education;Patient/family education;Manual techniques;Dry needling;Taping    PT Next Visit Plan spoke to supervising PT; will hold therapy 30 days; if pt doesnt' return by 03/13/20, will d/c.    PT [place] and Agree with Plan of Care Patient;Family member/caregiver    Family Member Consulted mom           Patient will benefit from skilled therapeutic intervention in order to improve the following deficits and impairments:  Decreased range of motion, Increased fascial  restricitons, Pain, Decreased activity tolerance, Impaired flexibility, Improper body mechanics,  Decreased mobility, Decreased strength, Postural dysfunction  Visit Diagnosis: Acute bilateral low back pain without sciatica  Abnormal posture  Muscle weakness (generalized)  Other symptoms and signs involving the musculoskeletal system     Problem List Patient Active Problem List   Diagnosis Date Noted  . Lumbarization, vertebra 12/12/2019  . Acute bilateral low back pain without sciatica 12/12/2019  . 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 Allergic reaction 08/13/2019  . Allergic urticaria 08/13/2019  . Chronic rhinitis 08/13/2019  . Breast hypertrophy in female 07/06/2019  . Insomnia due to other mental disorder 02/09/2019  . Irritable bowel syndrome with both constipation and diarrhea 02/09/2019  . Anxiety 02/09/2019  . Weight gain 12/02/2018  . Mood swings 12/02/2018  . Post concussion syndrome 12/02/2018  . Pancreatitis, gallstone 01/14/2018  . Social anxiety disorder 07/24/2017  . Multiple food allergies 05/28/2017  . Abdominal cramping 05/24/2017  . Bruising 05/24/2017  . Anhedonia 05/24/2017  . Moderate episode of recurrent major depressive disorder ([place]) 05/24/2017   PHYSICAL THERAPY DISCHARGE SUMMARY  Visits from Start of Care: 7  Current functional level related to goals / functional outcomes: See goals above   Remaining deficits: See note above for patient status   Education / Equipment: Home program  Plan: Patient agrees to discharge.  Patient goals were partially met. Patient is being discharged due to meeting the stated rehab goals.  ?????        Thank you for the referral of this patient. Rudell Cobb, MPT   WEAVER,CHRISTINA, [place], PTA 02/11/20 5:00 PM  [place], [place], [address] Phone: [phone]   Fax:   [phone]  Name: Laura Pineda MRN: [id number] Date of Birth: 11-24-2000

## 2020-02-16 ENCOUNTER — Ambulatory Visit (INDEPENDENT_AMBULATORY_CARE_PROVIDER_SITE_OTHER): Payer: 59 | Admitting: Sports Medicine

## 2020-02-16 DIAGNOSIS — M545 Low back pain, unspecified: Secondary | ICD-10-CM

## 2020-02-16 MED ORDER — GABAPENTIN 300 MG PO CAPS: ORAL_CAPSULE | ORAL | 3 refills | Status: DC

## 2020-02-16 NOTE — Assessment & Plan Note (Signed)
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 At this point due to persistent pain we are going to proceed with an MRI of the lumbar spine, gabapentin at night, meloxicam as needed, they are going to use Tylenol for some breakthrough pain, I am happy to add tramadol if needed, return to see me for MRI results.

## 2020-02-16 NOTE — Progress Notes (Signed)
    Procedures performed today:    None.  Independent interpretation of notes and tests performed by another provider:   None.  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 At this point due to persistent pain we are going to proceed with an MRI of the lumbar spine, gabapentin at night, meloxicam as needed, they are going to use Tylenol for some breakthrough pain, I am happy to add tramadol if needed, return to see me for MRI results.    ___________________________________________ Ihor Austin. Benjamin Stain, M.D., ABFM., CAQSM. Primary Care and Sports Medicine [facility]  Adjunct Instructor of Family Medicine  [employer]

## 2020-02-21 ENCOUNTER — Other Ambulatory Visit: Payer: Self-pay

## 2020-02-21 ENCOUNTER — Ambulatory Visit (INDEPENDENT_AMBULATORY_CARE_PROVIDER_SITE_OTHER): Payer: 59

## 2020-02-21 DIAGNOSIS — M545 Low back pain, unspecified: Secondary | ICD-10-CM

## 2020-03-11 ENCOUNTER — Other Ambulatory Visit: Payer: Self-pay | Admitting: Physician Assistant

## 2020-04-07 ENCOUNTER — Other Ambulatory Visit: Payer: Self-pay | Admitting: Sports Medicine

## 2020-04-07 DIAGNOSIS — M545 Low back pain, unspecified: Secondary | ICD-10-CM

## 2020-08-13 ENCOUNTER — Other Ambulatory Visit: Payer: Self-pay | Admitting: Sports Medicine

## 2020-08-13 DIAGNOSIS — M545 Low back pain, unspecified: Secondary | ICD-10-CM

## 2020-10-30 ENCOUNTER — Other Ambulatory Visit: Payer: Self-pay | Admitting: Physician Assistant

## 2021-01-25 ENCOUNTER — Telehealth (INDEPENDENT_AMBULATORY_CARE_PROVIDER_SITE_OTHER): Payer: 59 | Admitting: Physician Assistant

## 2021-01-25 ENCOUNTER — Encounter: Payer: Self-pay | Admitting: Physician Assistant

## 2021-01-25 DIAGNOSIS — J0140 Acute pansinusitis, unspecified: Secondary | ICD-10-CM | POA: Diagnosis not present

## 2021-01-25 MED ORDER — METHYLPREDNISOLONE 4 MG PO TBPK: ORAL_TABLET | ORAL | 0 refills | Status: DC

## 2021-01-25 MED ORDER — AZITHROMYCIN 250 MG PO TABS: ORAL_TABLET | ORAL | 0 refills | Status: DC

## 2021-01-25 NOTE — Progress Notes (Signed)
..Virtual Visit via Video Note  I connected with Laura Pineda on 01/25/21 at  4:20 PM EST by a video enabled telemedicine application and verified that I am speaking with the correct person using two identifiers.  Location: Patient: home Provider: clinic  .Marland KitchenParticipating in visit:  Patient: Laura Pineda Provider: Tandy Gaw PA-C Provider in training: Asher Muir PA-C   I discussed the limitations of evaluation and management by telemedicine and the availability of in person appointments. The patient expressed understanding and agreed to proceed.  History of Present Illness: Patient is a 20 year old female who presents to the clinic to discuss 3 weeks of cough, congestion, sinus pressure.  She has been taking over-the-counter Mucinex, cough drops, Tylenol, ibuprofen.  she initially thought it was allergies but it has not gone away.  She is blowing and coughing up green to yellow mucus.  She denies any fever, chills, body aches, shortness of breath.  Her mother is also sick with similar symptoms.  She did take a negative COVID test initially but has been 3 weeks.  .. Active Ambulatory Problems    Diagnosis Date Noted   Abdominal cramping 05/24/2017   Bruising 05/24/2017   Anhedonia 05/24/2017   Moderate episode of recurrent major depressive disorder (HCC) 05/24/2017   Multiple food allergies 05/28/2017   Social anxiety disorder 07/24/2017   Weight gain 12/02/2018   Mood swings 12/02/2018   Post concussion syndrome 12/02/2018   Insomnia due to other mental disorder 02/09/2019   Irritable bowel syndrome with both constipation and diarrhea 02/09/2019   Anxiety 02/09/2019   Breast hypertrophy in female 07/06/2019   Pancreatitis, gallstone 01/14/2018   Allergic reaction 08/13/2019   Allergic urticaria 08/13/2019   Chronic rhinitis 08/13/2019   Lumbarization, vertebra 12/12/2019   Acute bilateral low back pain without sciatica 12/12/2019   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  No labored breathing or wheezing  .Marland Kitchen Active Ambulatory Problems    Diagnosis Date Noted   Abdominal cramping 05/24/2017   Bruising 05/24/2017   Anhedonia 05/24/2017   Moderate episode of recurrent major depressive disorder (HCC) 05/24/2017   Multiple food allergies 05/28/2017   Social anxiety disorder 07/24/2017   Weight gain 12/02/2018   Mood swings 12/02/2018   Post concussion syndrome 12/02/2018   Insomnia due to other mental disorder 02/09/2019   Irritable bowel syndrome with both constipation and diarrhea 02/09/2019   Anxiety 02/09/2019   Breast hypertrophy in female 07/06/2019   Pancreatitis, gallstone 01/14/2018   Allergic reaction 08/13/2019   Allergic urticaria 08/13/2019   Chronic rhinitis 08/13/2019   Lumbarization, vertebra 12/12/2019   Acute bilateral low back pain without sciatica 12/12/2019   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  Diagnoses and all orders for this visit:  Acute non-recurrent pansinusitis -     azithromycin (ZITHROMAX Z-PAK) 250 MG tablet; Take 2 tablets (500 mg) on  Day 1,  followed by 1 tablet (250 mg) once daily on Days 2 through 5. -     methylPREDNISolone (MEDROL DOSEPAK)  4 MG TBPK tablet; Take as directed by package insert.  3 weeks of sinus symptoms.  Treated with zpak due to PCN allergy and medrol dose pack.  Delsym for cough.  Follow up as needed or if symptoms persist.     Follow Up Instructions:    I discussed the assessment  and treatment plan with the patient. The patient was provided an opportunity to ask questions and all were answered. The patient agreed with the plan and demonstrated an understanding of the instructions.   The patient was advised to call back or seek an in-person evaluation if the symptoms worsen or if the condition fails to improve as anticipated.   Tandy Gaw, PA-C

## 2021-01-31 ENCOUNTER — Other Ambulatory Visit: Payer: Self-pay | Admitting: Sports Medicine

## 2021-01-31 DIAGNOSIS — M545 Low back pain, unspecified: Secondary | ICD-10-CM

## 2021-02-07 ENCOUNTER — Telehealth (INDEPENDENT_AMBULATORY_CARE_PROVIDER_SITE_OTHER): Payer: 59 | Admitting: Physician Assistant

## 2021-02-07 ENCOUNTER — Encounter: Payer: Self-pay | Admitting: Physician Assistant

## 2021-02-07 VITALS — Ht 68.0 in | Wt 180.0 lb

## 2021-02-07 DIAGNOSIS — R051 Acute cough: Secondary | ICD-10-CM

## 2021-02-07 DIAGNOSIS — J3089 Other allergic rhinitis: Secondary | ICD-10-CM

## 2021-02-07 MED ORDER — CLARITIN-D 24 HOUR 10-240 MG PO TB24: 1.0000 | ORAL_TABLET | Freq: Every day | ORAL | 0 refills | Status: DC

## 2021-02-07 NOTE — Progress Notes (Signed)
..  Virtual Visit via Video Note  I connected with Laura Pineda on 02/08/21 at  3:00 PM EST by a video enabled telemedicine application and verified that I am speaking with the correct person using two identifiers.  Location: Patient: home Provider: clinic  .Marland KitchenParticipating in visit:  Patient: Laura Pineda Provider: Tandy Gaw PA-C Provider in training: Asher Muir PA-S   I discussed the limitations of evaluation and management by telemedicine and the availability of in person appointments. The patient expressed understanding and agreed to proceed.  History of Present Illness: Pt is a 20 yo female who calls into the clinic to discuss cough, itching eyes, running nose that started a few days after she finished the zpak and medrol dose pack. She was feeling better. No fever, chills, body aches. Not taken covid test. No other sick contacts.   .. Active Ambulatory Problems    Diagnosis Date Noted   Abdominal cramping 05/24/2017   Bruising 05/24/2017   Anhedonia 05/24/2017   Moderate episode of recurrent major depressive disorder (HCC) 05/24/2017   Multiple food allergies 05/28/2017   Social anxiety disorder 07/24/2017   Weight gain 12/02/2018   Mood swings 12/02/2018   Post concussion syndrome 12/02/2018   Insomnia due to other mental disorder 02/09/2019   Irritable bowel syndrome with both constipation and diarrhea 02/09/2019   Anxiety 02/09/2019   Breast hypertrophy in female 07/06/2019   Pancreatitis, gallstone 01/14/2018   Allergic reaction 08/13/2019   Allergic urticaria 08/13/2019   Chronic rhinitis 08/13/2019   Lumbarization, vertebra 12/12/2019   Acute bilateral low back pain without sciatica 12/12/2019   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    Assessment and Plan: . Laura Pineda was seen today for acute visit, sore throat and migraine.  Diagnoses and all orders for this visit:  Non-seasonal allergic rhinitis, unspecified trigger -     loratadine-pseudoephedrine (CLARITIN-D 24 HOUR) 10-240 MG 24 hr tablet; Take 1 tablet by mouth daily.  Acute cough -     loratadine-pseudoephedrine (CLARITIN-D 24 HOUR) 10-240 MG 24 hr tablet; Take 1 tablet by mouth daily.  Allergies vs another URI.  Rest and watch for worsening symptoms for today and tomorrow. Ok to go back to school Wednesday.  Start claritin D and flonase.  If no fever or worsening symptoms not contagious.     Follow Up Instructions:    I discussed the assessment and treatment plan with the patient. The patient was provided an opportunity to ask questions and all were answered. The patient agreed with the plan and demonstrated an understanding of the instructions.   The patient was advised to call back or seek an in-person evaluation if the symptoms worsen or if the condition fails to improve as anticipated.   Sunday, PA-C

## 2021-02-08 DIAGNOSIS — R051 Acute cough: Secondary | ICD-10-CM | POA: Insufficient documentation

## 2021-02-08 DIAGNOSIS — J3089 Other allergic rhinitis: Secondary | ICD-10-CM | POA: Insufficient documentation

## 2021-02-11 ENCOUNTER — Encounter: Payer: Self-pay | Admitting: Physician Assistant

## 2021-04-14 ENCOUNTER — Other Ambulatory Visit: Payer: Self-pay | Admitting: Sports Medicine

## 2021-04-14 DIAGNOSIS — M545 Low back pain, unspecified: Secondary | ICD-10-CM

## 2021-05-09 ENCOUNTER — Other Ambulatory Visit: Payer: Self-pay

## 2021-05-19 ENCOUNTER — Other Ambulatory Visit: Payer: Self-pay

## 2021-05-19 ENCOUNTER — Emergency Department (INDEPENDENT_AMBULATORY_CARE_PROVIDER_SITE_OTHER)
Admission: EM | Admit: 2021-05-19 | Discharge: 2021-05-19 | Disposition: A | Discharge status: Home / Self Care | Payer: 59 | Source: Home / Self Care | Attending: Family Medicine | Admitting: Family Medicine

## 2021-05-19 DIAGNOSIS — M25532 Pain in left wrist: Secondary | ICD-10-CM

## 2021-05-19 DIAGNOSIS — M778 Other enthesopathies, not elsewhere classified: Secondary | ICD-10-CM

## 2021-05-19 MED ORDER — PREDNISONE 10 MG (21) PO TBPK: ORAL_TABLET | Freq: Every day | ORAL | 0 refills | Status: DC

## 2021-05-19 NOTE — ED Provider Notes (Signed)
?[place] ? ? ? ?CSN: [id number] ?Arrival date & time: 05/19/21  1808 ? ? ?  ? ?History   ?Chief Complaint ?Chief Complaint  ?Patient presents with  ? Wrist Pain  ? ? ?HPI ?Laura Pineda is a 21 y.o. female.  ? ?HPI ? ?Works in a Teacher, adult education.  Uses both hands a lot.  Has left hand and wrist pain for the last couple of days for no good reason.  No trauma.  Does not feel like she is done anything different as far as overuse.  Has had some swelling.  Pain is mostly in the long finger. ? ?Past Medical History:  ?Diagnosis Date  ? GERD (gastroesophageal reflux disease)   ? ? ?Patient Active Problem List  ? Diagnosis Date Noted  ? Non-seasonal allergic rhinitis 02/08/2021  ? Acute cough 02/08/2021  ? Lumbarization, vertebra 12/12/2019  ? Acute bilateral low back pain without sciatica 12/12/2019  ? 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 Allergic reaction 08/13/2019  ? Allergic urticaria 08/13/2019  ? Chronic rhinitis 08/13/2019  ? Breast hypertrophy in female 07/06/2019  ? Insomnia due to other mental disorder 02/09/2019  ? Irritable bowel syndrome with both constipation and diarrhea 02/09/2019  ? Anxiety 02/09/2019  ? Weight gain 12/02/2018  ? Mood swings 12/02/2018  ? Post concussion syndrome 12/02/2018  ? Pancreatitis, gallstone 01/14/2018  ? Social anxiety disorder 07/24/2017  ? Multiple food allergies 05/28/2017  ? Abdominal cramping 05/24/2017  ? Bruising 05/24/2017  ? Anhedonia 05/24/2017  ? Moderate episode of recurrent major depressive disorder ([place]) 05/24/2017  ? ? ?Past Surgical History:  ?Procedure Laterality Date  ? CHOLECYSTECTOMY  01/15/2018  ? ? ?OB History   ?No obstetric history on file. ?  ? ? ? ?Home Medications   ? ?Prior to Admission medications   ?Medication Sig Start Date End Date Taking? Authorizing Provider  ?predniSONE (STERAPRED UNI-PAK 21 TAB) 10 MG (21) TBPK tablet Take by mouth daily. tad 05/19/21  Yes Raylene Everts, MD  ?EPINEPHrine (AUVI-Q) 0.3 mg/0.3 mL IJ SOAJ injection Inject  0.3 mLs (0.3 mg total) into the muscle as needed for anaphylaxis. 08/13/19   Bobbitt, [place] Muta, MD  ?fluticasone [facility] 50 MCG/ACT nasal spray SPRAY 2 SPRAYS INTO EACH NOSTRIL EVERY DAY 11/01/20   Breeback, Jade L, PA-C  ?loratadine-pseudoephedrine (CLARITIN-D 24 HOUR) 10-240 MG 24 hr tablet Take 1 tablet by mouth daily. 02/07/21   Breeback, Jade L, PA-C  ?meloxicam (MOBIC) 15 MG tablet TAKE 1 TABLET BY MOUTH IN THE MORNING WITH A MEAL FOR 2 WEEKS, THEN 1 DAILY AS NEEDED FOR PAIN 01/31/21   Silverio Decamp, MD  ? ? ?Family History ?Family History  ?Problem Relation Age of Onset  ? Hyperlipidemia Mother   ? Allergic rhinitis Mother   ? Allergic rhinitis Father   ? Sinusitis Father   ? Bipolar disorder Brother   ? Asthma Maternal Grandfather   ? Urticaria Neg Hx   ? Eczema Neg Hx   ? Immunodeficiency Neg Hx   ? Angioedema Neg Hx   ? Atopy Neg Hx   ? ? ?Social History ?Social History  ? ?Tobacco Use  ? Smoking status: Never  ? Smokeless tobacco: Never  ?Vaping Use  ? Vaping Use: Never used  ?Substance Use Topics  ? Alcohol use: No  ? Drug use: No  ? ? ? ?Allergies   ?Coconut oil, Baby oil, Gramineae pollens, Penicillins, Clindamycin/lincomycin, Coconut flavor, Lamictal [lamotrigine], Adhesive [tape], Amoxicillin, Casein, Coffee flavor, Lanolin-mineral oil-peg-4 dilaurate  [  cameo oil], and Wheat bran ? ? ?Review of Systems ?Review of Systems ?See HPI ? ?Physical Exam ?Triage Vital Signs ?ED Triage Vitals  ?Enc Vitals Group  ?   BP 05/19/21 1818 125/80  ?   Pulse Rate 05/19/21 1818 90  ?   Resp 05/19/21 1818 14  ?   Temp 05/19/21 1818 98.5 ?F (36.9 ?C)  ?   Temp Source 05/19/21 1818 Oral  ?   SpO2 05/19/21 1818 99 %  ?   Weight --   ?   Height --   ?   Head Circumference --   ?   Peak Flow --   ?   Pain Score 05/19/21 1820 3  ?   Pain Loc --   ?   Pain Edu? --   ?   Excl. in [place]? --   ? ?No data found. ? ?Updated Vital Signs ?BP 125/80 (BP Location: Left Arm)   Pulse 90   Temp 98.5 ?F (36.9 ?C) (Oral)   Resp  14   SpO2 99%  ? ?   ? ?Physical Exam ?Constitutional:   ?   General: She is not in acute distress. ?   Appearance: She is well-developed. She is not ill-appearing.  ?HENT:  ?   Head: Normocephalic and atraumatic.  ?   Mouth/Throat:  ?   Comments: Mask is in place ?Eyes:  ?   Conjunctiva/sclera: Conjunctivae normal.  ?   Pupils: Pupils are equal, round, and reactive to light.  ?Cardiovascular:  ?   Rate and Rhythm: Normal rate.  ?Pulmonary:  ?   Effort: Pulmonary effort is normal. No respiratory distress.  ?Abdominal:  ?   General: There is no distension.  ?   Palpations: Abdomen is soft.  ?Musculoskeletal:     ?   General: Normal range of motion.  ?   Cervical back: Normal range of motion.  ?   Comments: Left hand and wrist examined.  Mild soft tissue swelling on the dorsum of the hand centrally.  Tenderness over the extensor tendon of the long finger and pain with resistance to finger extension.  Weak grip strength.  No tenderness of the carpal region, forearm or elbow  ?Skin: ?   General: Skin is warm and dry.  ?   Coloration: Skin is pale.  ?   Comments: Skin is mottled  ?Neurological:  ?   General: No focal deficit present.  ?   Mental Status: She is alert.  ?Psychiatric:     ?   Mood and Affect: Mood normal.     ?   Behavior: Behavior normal.  ? ? ? ?UC Treatments / Results  ?Labs ?(all labs ordered are listed, but only abnormal results are displayed) ?Labs Reviewed - No data to display ? ?EKG ? ? ?Radiology ?No results found. ? ?Procedures ?Procedures (including critical care time) ? ?Medications Ordered in UC ?Medications - No data to display ? ?Initial Impression / Assessment and Plan / UC Course  ?I have reviewed the triage vital signs and the nursing notes. ? ?Pertinent labs & imaging results that were available during my care of the patient were reviewed by me and considered in my medical decision making (see chart for details). ? ?  ?Reviewed treatment of tendinitis.  Follow-up with sports medicine if  fails to improve ?Final Clinical Impressions(s) / UC Diagnoses  ? ?Final diagnoses:  ?Left wrist pain  ?Tendinitis of extensor tendon of left hand  ? ? ? ?  Discharge Instructions   ? ?  ?Continue with ice to area ?Wear splint for next couple of days.  Remove for washing or lotion then replace ?No heavy use of left hand ?Take Medrol pack as directed ?See your doctor if not improving by next week ? ? ?ED Prescriptions   ? ? Medication Sig Dispense Auth. Provider  ? predniSONE (STERAPRED UNI-PAK 21 TAB) 10 MG (21) TBPK tablet Take by mouth daily. tad 21 tablet Raylene Everts, MD  ? ?  ? ?PDMP not reviewed this encounter. ?  ?Raylene Everts, MD ?05/19/21 1913 ? ?

## 2021-05-19 NOTE — Discharge Instructions (Addendum)
Continue with ice to area ?Wear splint for next couple of days.  Remove for washing or lotion then replace ?No heavy use of left hand ?Take Medrol pack as directed ?See your doctor if not improving by next week ?

## 2021-05-19 NOTE — ED Triage Notes (Signed)
Pt presents with left wrist pain for "a few days" no known injury. Pt states she was unable to pick up anything heavy today at work. ?

## 2021-06-09 ENCOUNTER — Other Ambulatory Visit: Payer: Self-pay

## 2021-07-15 ENCOUNTER — Other Ambulatory Visit: Payer: Self-pay | Admitting: Neurology

## 2021-07-15 DIAGNOSIS — J3089 Other allergic rhinitis: Secondary | ICD-10-CM

## 2021-07-15 DIAGNOSIS — R051 Acute cough: Secondary | ICD-10-CM

## 2021-07-15 MED ORDER — CLARITIN-D 24 HOUR 10-240 MG PO TB24: 1.0000 | ORAL_TABLET | Freq: Every day | ORAL | 1 refills | Status: DC

## 2021-08-26 ENCOUNTER — Emergency Department (INDEPENDENT_AMBULATORY_CARE_PROVIDER_SITE_OTHER)
Admission: EM | Admit: 2021-08-26 | Discharge: 2021-08-26 | Disposition: A | Discharge status: Home / Self Care | Payer: 59 | Source: Home / Self Care

## 2021-08-26 DIAGNOSIS — G43809 Other migraine, not intractable, without status migrainosus: Secondary | ICD-10-CM | POA: Diagnosis not present

## 2021-08-26 DIAGNOSIS — R432 Parageusia: Secondary | ICD-10-CM

## 2021-08-26 MED ORDER — DICLOFENAC SODIUM 50 MG PO TBEC: 50.0000 mg | DELAYED_RELEASE_TABLET | Freq: Two times a day (BID) | ORAL | 0 refills | Status: DC

## 2021-08-26 NOTE — Discharge Instructions (Addendum)
Suspect taste change is related to your migraine. You declined a shot today. Try medication prescribed to see if this helps with your headache. Follow-up with PCP or neurologist if minimal improvement.

## 2021-08-26 NOTE — ED Provider Notes (Signed)
[facility]    CSN: [id number] Arrival date & time: 08/26/21  1831      History   Chief Complaint Chief Complaint  Patient presents with   sweet taste    HPI Laura Pineda is a 21 y.o. female.   Patient presents with concerns of migraine and a sweet taste in her mouth. She reports history of migraines but has not been prescribed anything for them in the past. She states it seems like she has had a headache for the past month. Today she noticed that there is a constant sweet taste in her mouth where everything tastes sweet. She has not had any mouth sores, burning, or other oral symptoms. She reports some light sensitivity she has had with the headache but denies vision changes or dizziness. She has not taken or tried anything for the headache.   The history is provided by the patient.    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   Allergic reaction 08/13/2019   Allergic urticaria 08/13/2019   Chronic rhinitis 08/13/2019   Breast hypertrophy in female 07/06/2019   Insomnia due to other mental disorder 02/09/2019   Irritable bowel syndrome with both constipation and diarrhea 02/09/2019   Anxiety 02/09/2019   Weight gain 12/02/2018   Mood swings 12/02/2018   Post concussion syndrome 12/02/2018   Pancreatitis, gallstone 01/14/2018   Social anxiety disorder 07/24/2017   Multiple food allergies 05/28/2017   Abdominal cramping 05/24/2017   Bruising 05/24/2017   Anhedonia 05/24/2017   Moderate episode of recurrent major depressive disorder (HCC) 05/24/2017    Past Surgical History:  Procedure Laterality Date   CHOLECYSTECTOMY  01/15/2018    OB History   No  obstetric history on file.      Home Medications    Prior to Admission medications   Medication Sig Start Date End Date Taking? Authorizing Provider  diclofenac (VOLTAREN) 50 MG EC tablet Take 1 tablet (50 mg total) by mouth 2 (two) times daily. 08/26/21  Yes Rashena Dowling L, PA  EPINEPHrine (AUVI-Q) 0.3 mg/0.3 mL IJ SOAJ injection Inject 0.3 mLs (0.3 mg total) into the muscle as needed for anaphylaxis. 08/13/19   Bobbitt, Heywood Iles, MD  fluticasone (FLONASE) 50 MCG/ACT nasal spray SPRAY 2 SPRAYS INTO EACH NOSTRIL EVERY DAY 11/01/20   Breeback, Jade L, PA-C  loratadine-pseudoephedrine (CLARITIN-D 24 HOUR) 10-240 MG 24 hr tablet Take 1 tablet by mouth daily. 07/15/21   Breeback, Jade L, PA-C  meloxicam (MOBIC) 15 MG tablet TAKE 1 TABLET BY MOUTH IN THE MORNING WITH A MEAL FOR 2 WEEKS, THEN 1 DAILY AS NEEDED FOR PAIN 01/31/21   Monica Becton, MD  predniSONE (STERAPRED UNI-PAK 21 TAB) 10 MG (21) TBPK tablet Take by mouth daily. tad Patient not taking: Reported on 08/26/2021 05/19/21   Eustace Moore, MD    Family History Family History  Problem Relation Age of Onset   Hyperlipidemia Mother    Allergic rhinitis Mother    Allergic rhinitis Father    Sinusitis Father    Bipolar disorder Brother    Asthma Maternal Grandfather    Urticaria Neg Hx    Eczema Neg Hx    Immunodeficiency Neg Hx  Angioedema Neg Hx    Atopy Neg Hx     Social History Social History   Tobacco Use   Smoking status: Never   Smokeless tobacco: Never  Vaping Use   Vaping Use: Never used  Substance Use Topics   Alcohol use: No   Drug use: No     Allergies   Coconut (cocos nucifera), Baby oil, Gramineae pollens, Penicillins, Clindamycin/lincomycin, Coconut flavor, Lamictal [lamotrigine], Adhesive [tape], Amoxicillin, Casein, Coffee flavor, Lanolin-mineral oil-peg-4 dilaurate  [cameo oil], and Wheat bran   Review of Systems Review of Systems  Constitutional:  Negative for fatigue and fever.   Eyes:  Positive for photophobia. Negative for pain and visual disturbance.  Cardiovascular:  Negative for chest pain.  Musculoskeletal:  Negative for myalgias.  Skin:  Negative for rash.  Neurological:  Positive for headaches. Negative for dizziness, syncope and numbness.     Physical Exam Triage Vital Signs ED Triage Vitals [08/26/21 1841]  Enc Vitals Group     BP 115/84     Pulse Rate 92     Resp 14     Temp 99.6 F (37.6 C)     Temp Source Oral     SpO2 100 %     Weight      Height      Head Circumference      Peak Flow      Pain Score 4     Pain Loc      Pain Edu?      Excl. in GC?    No data found.  Updated Vital Signs BP 115/84 (BP Location: Left Arm)   Pulse 92   Temp 99.6 F (37.6 C) (Oral)   Resp 14   SpO2 100%   Visual Acuity Right Eye Distance:   Left Eye Distance:   Bilateral Distance:    Right Eye Near:   Left Eye Near:    Bilateral Near:     Physical Exam Vitals and nursing note reviewed.  Constitutional:      General: She is not in acute distress. HENT:     Head: Normocephalic.     Right Ear: Tympanic membrane, ear canal and external ear normal.     Left Ear: Tympanic membrane, ear canal and external ear normal.     Nose: Nose normal.     Mouth/Throat:     Mouth: Mucous membranes are moist.     Pharynx: Oropharynx is clear. No oropharyngeal exudate or posterior oropharyngeal erythema.  Eyes:     Extraocular Movements: Extraocular movements intact.     Conjunctiva/sclera: Conjunctivae normal.     Pupils: Pupils are equal, round, and reactive to light.  Cardiovascular:     Rate and Rhythm: Normal rate and regular rhythm.     Heart sounds: Normal heart sounds.  Pulmonary:     Effort: Pulmonary effort is normal.     Breath sounds: Normal breath sounds.  Musculoskeletal:     Cervical back: Normal range of motion.  Skin:    Findings: No rash.  Neurological:     Mental Status: She is alert.  Psychiatric:        Mood and Affect:  Mood normal.      UC Treatments / Results  Labs (all labs ordered are listed, but only abnormal results are displayed) Labs Reviewed - No data to display  EKG   Radiology No results found.  Procedures Procedures (including critical care time)  Medications Ordered in UC Medications - No data  to display  Initial Impression / Assessment and Plan / UC Course  I have reviewed the triage vital signs and the nursing notes.  Pertinent labs & imaging results that were available during my care of the patient were reviewed by me and considered in my medical decision making (see chart for details).     Suspect dysgeusia may be related to migraine. Offered Toradol injection but patient declined any injection. Trial diclofenac and recommend follow-up with PCP. Discussed ER precautions.   E/M: 1 acute uncomplicated illness, no data, moderate risk due to prescription management  Final Clinical Impressions(s) / UC Diagnoses   Final diagnoses:  Dysgeusia  Other migraine without status migrainosus, not intractable     Discharge Instructions      Suspect taste change is related to your migraine. You declined a shot today. Try medication prescribed to see if this helps with your headache. Follow-up with PCP or neurologist if minimal improvement.     ED Prescriptions     Medication Sig Dispense Auth. Provider   diclofenac (VOLTAREN) 50 MG EC tablet Take 1 tablet (50 mg total) by mouth 2 (two) times daily. 10 tablet Vallery Sa, Johnatan Baskette L, PA      PDMP not reviewed this encounter.   Estanislado Pandy, [place] 08/26/21 1900

## 2021-08-26 NOTE — ED Triage Notes (Signed)
Pt presents with c/o "constant sweet taste" in her mouth that began today

## 2021-10-25 ENCOUNTER — Telehealth: Payer: Self-pay | Admitting: Physician Assistant

## 2021-10-25 NOTE — Telephone Encounter (Signed)
Patient wants forms filled out for her dog to be an emotional support animal. Do we have these forms?

## 2021-10-26 NOTE — Telephone Encounter (Signed)
LVM letting patient know.

## 2021-10-26 NOTE — Telephone Encounter (Signed)
We don't have forms but we can write a letter. If specific forms are required (for an apartment, etc) the patient usually brings those to [place]. Letter written and available in mychart.

## 2022-02-23 ENCOUNTER — Other Ambulatory Visit: Payer: Self-pay | Admitting: Physician Assistant

## 2022-02-23 DIAGNOSIS — J3089 Other allergic rhinitis: Secondary | ICD-10-CM

## 2022-02-23 DIAGNOSIS — R051 Acute cough: Secondary | ICD-10-CM

## 2022-02-28 ENCOUNTER — Encounter: Payer: Self-pay | Admitting: Physician Assistant

## 2022-05-12 ENCOUNTER — Ambulatory Visit (INDEPENDENT_AMBULATORY_CARE_PROVIDER_SITE_OTHER): Payer: 59 | Admitting: Physician Assistant

## 2022-05-12 VITALS — BP 123/82 | HR 80 | Ht 68.0 in | Wt 156.0 lb

## 2022-05-12 DIAGNOSIS — M545 Low back pain, unspecified: Secondary | ICD-10-CM

## 2022-05-12 DIAGNOSIS — M79645 Pain in left finger(s): Secondary | ICD-10-CM

## 2022-05-12 DIAGNOSIS — M79642 Pain in left hand: Secondary | ICD-10-CM

## 2022-05-12 MED ORDER — PREDNISONE 10 MG (21) PO TBPK: ORAL_TABLET | Freq: Every day | ORAL | 0 refills | Status: DC

## 2022-05-12 MED ORDER — MELOXICAM 15 MG PO TABS: ORAL_TABLET | ORAL | 1 refills | Status: DC

## 2022-05-12 NOTE — Progress Notes (Addendum)
Acute Office Visit  Subjective:     [name], female    DOB: May 06, 2000, 22 y.o.   MRN: [id number]  Chief Complaint  Patient presents with   Hand Pain    HPI Patient is in today for left hand pain and pain in left fingers for a few weeks.  She works at [employer] and uses those fingers and hands a lot. She reports working makes symptoms worse. Pain is located in ring and pinky finger and into palm. At times the pain comes from left upper back and down left arm into hands. She has taken some mobic that helps some. She feels like her ring finger is "tight". Rest makes it feel the best. No known trauma.   .. Active Ambulatory Problems    Diagnosis Date Noted   Abdominal cramping 05/24/2017   Bruising 05/24/2017   Anhedonia 05/24/2017   Moderate episode of recurrent major depressive disorder (HCC) 05/24/2017   Multiple food allergies 05/28/2017   Social anxiety disorder 07/24/2017   Weight gain 12/02/2018   Mood swings 12/02/2018   Post concussion syndrome 12/02/2018   Insomnia due to other mental disorder 02/09/2019   Irritable bowel syndrome with both constipation and diarrhea 02/09/2019   Anxiety 02/09/2019   Breast hypertrophy in female 07/06/2019   Pancreatitis, gallstone 01/14/2018   Allergic reaction 08/13/2019   Allergic urticaria 08/13/2019   Chronic rhinitis 08/13/2019   Lumbarization, vertebra 12/12/2019   Acute bilateral low back pain without sciatica 12/12/2019   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      Objective:    BP 123/82   Pulse 80   Ht 5\' 8"  (1.727 m)   Wt 156 lb (70.8 kg)   SpO2 100%   BMI 23.72 kg/m  BP Readings from Last 3  Encounters:  05/12/22 123/82  08/26/21 115/84  05/19/21 125/80   Wt Readings from Last 3 Encounters:  05/12/22 156 lb (70.8 kg)  02/07/21 180 lb (81.6 kg) (94 %, Z= 1.59)*  12/09/19 189 lb (85.7 kg) (96 %, Z= 1.80)*   * Growth percentiles are based on CDC (Girls, 2-20 Years) data.      Physical Exam Able to open and close left hand Hand grip is weaker at 4/5.  No trigger finger No joint pain to palpation There is some thickness of the palmar tendon to ring finger       Assessment & Plan:  .Marland KitchenThera was seen today for hand pain.  Diagnoses and all orders for this visit:  Left hand pain -     predniSONE (STERAPRED UNI-PAK 21 TAB) 10 MG (21) TBPK tablet; Take by mouth daily. tad -     meloxicam (MOBIC) 15 MG tablet; Take one tablet as needed for pain. -     Ambulatory referral to Hand Surgery  Pain in left finger(s) -     predniSONE (STERAPRED UNI-PAK 21 TAB) 10 MG (21) TBPK tablet; Take by mouth daily. tad -     meloxicam (MOBIC) 15 MG tablet; Take one tablet as needed for pain. -     Ambulatory referral to Hand Surgery  Acute bilateral low back pain without sciatica -  meloxicam (MOBIC) 15 MG tablet; Take one tablet as needed for pain.   On exam the tendons in the palm of left hand feels thicker but no pull of ring finger at this time, no trigger finger.  Written out of work through Tuesday of neck week for rest Prednisone burst given Mobic to start as needed after that Discussed use of stress ball for hand rehab Will make referral to ortho  Tandy Gaw, PA-C

## 2022-05-12 NOTE — Patient Instructions (Signed)
Will refer to hand orthopedic Written out of work until tuesday  Dupuytren's Contracture Dupuytren's contracture is a condition in which tissue under the skin of the palm becomes thick. This causes one or more of the fingers to curl inward (contract) toward the palm. After a while, the fingers may not be able to straighten out. This condition affects some or all of the fingers and the palm of the hand. This condition may affect one or both hands. Dupuytren's contracture is a long-term (chronic) condition that develops (progresses) slowly over time. There is no cure, but symptoms can be managed and progression can be slowed with treatment. This condition is usually not dangerous or painful, but it can interfere with everyday tasks. What are the causes?  This condition is caused by tissue (fascia) in the palm that gets thicker and tighter. When the fascia thickens, it pulls on the cords of tissue (tendons) that control finger movement. This causes the fingers to contract. The cause of fascia thickening is not known. However, the condition is often passed along from parent to child (inherited). What increases the risk? The following factors may make you more likely to develop this condition: Being 26 years of age or older. Being female. Having a family history of this condition. Using tobacco products, including cigarettes, chewing tobacco, and e-cigarettes. Drinking alcohol excessively. Having diabetes. Having a seizure disorder. What are the signs or symptoms? Early symptoms of this condition may include: Thick, puckered skin on the hand. One or more lumps (nodules) on the palm. Nodules may be tender when they first appear, but they are generally painless. Later symptoms of this condition may include: Thick cords of tissue in the palm. Fingers curled up toward the palm. Inability to straighten the fingers into their normal position. Though this condition is usually painless, you may have  discomfort when holding or grabbing objects. How is this diagnosed? This condition is diagnosed with a physical exam, which may include: Looking at your hands and feeling your palms. This is to check for thickened fascia and nodules. Measuring finger motion. Doing the [place] tabletop test. You may be asked to try to put your hand on a surface, with your palm down and your fingers straight out. How is this treated? There is no cure for this condition, but treatment can relieve discomfort and make symptoms more manageable. Treatment options may include: Physical therapy. This can strengthen your hand and increase flexibility. Occupational therapy. This can help you with everyday tasks that may be more difficult because of your condition. Shots (injections). Substances may be injected into your hand, such as: Medicines that help to decrease swelling (corticosteroids). Proteins (collagenase) to weaken thick tissue. After a collagenase injection, your health care provider may stretch your fingers. Needle aponeurotomy. A needle is pushed through the skin and into the fascia. Moving the needle against the fascia can weaken or break up the thick tissue. Surgery. This may be needed if your condition causes discomfort or interferes with everyday activities. Physical therapy is usually needed after surgery. No treatment is guaranteed to cure this condition. Recurrence of symptoms is common. Follow these instructions at home: Hand care Take these actions to help protect your hand from possible injury: Use tools that have padded grips. Wear protective gloves while you work with your hands. Avoid repetitive hand movements. General instructions Take over-the-counter and prescription medicines only as told by your health care provider. Manage any other conditions that you have, such as diabetes. If physical therapy was  prescribed, do exercises as told by your health care provider. Do not use any products  that contain nicotine or tobacco, such as cigarettes, e-cigarettes, and chewing tobacco. If you need help quitting, ask your health care provider. If you drink alcohol: Limit how much you have to: 0-1 drink a day for women who are not pregnant. 0-2 drinks a day for men. Be aware of how much alcohol is in your drink. In the [place] one drink equals one 12 oz bottle of beer (355 mL), one 5 oz glass of wine (148 mL), or one 1 oz glass of hard liquor (44 mL). Keep all follow-up visits as told by your health care provider. This is important. Contact a health care provider if: You develop new symptoms, or your symptoms get worse. You have pain that gets worse or does not get better with medicine. You have difficulty or discomfort with everyday tasks. You develop numbness or tingling. Get help right away if: You have severe pain. Your fingers change color or become unusually cold. Summary Dupuytren's contracture is a condition in which tissue under the skin of the palm becomes thick. This condition is caused by tissue (fascia) that thickens. When it thickens, it pulls on the cords of tissue (tendons) that control finger movement and makes the fingers contract. You are more likely to develop this condition if you are a man, are over 72 years of age, have a family history of the condition, and drink a lot of alcohol. This condition can be treated with physical and occupational therapy, injections, and surgery. Follow instructions about how to care for your hand. Get help right away if you have severe pain or your fingers change color or become cold. This information is not intended to replace advice given to you by your health care provider. Make sure you discuss any questions you have with your health care provider. Document Revised: 06/08/2020 Document Reviewed: 06/08/2020 Elsevier Patient Education  [place].

## 2022-05-15 ENCOUNTER — Telehealth: Payer: Self-pay

## 2022-05-15 ENCOUNTER — Encounter: Payer: Self-pay | Admitting: Physician Assistant

## 2022-05-15 DIAGNOSIS — M79645 Pain in left finger(s): Secondary | ICD-10-CM | POA: Insufficient documentation

## 2022-05-15 NOTE — Telephone Encounter (Signed)
Patient mother called - states Laura Pineda's pain in wrist / hand is radiating upward toward shoulder. She feels this information was missed at office visit and wanted to be sure you were aware of this before the referral to specialist is placed. Requesting a return call.

## 2022-05-17 NOTE — Telephone Encounter (Signed)
Left detailed voice mail message on patient mother "susan " phone - authorization given on DPR

## 2022-05-17 NOTE — Telephone Encounter (Signed)
Updated chart.

## 2022-05-18 ENCOUNTER — Telehealth: Payer: Self-pay | Admitting: Physician Assistant

## 2022-05-18 NOTE — Telephone Encounter (Signed)
Referral, clinical notes and copies of insurance cards have been faxed to [place] at [phone]. Contacted office and scheduled an appointment for 05/26/2022 at 9:45am with Dr. Fredna Dow

## 2022-05-18 NOTE — Telephone Encounter (Signed)
05/18/2022-Left message on mom's voicemail notifying her of patients Ortho appointment on 05/26/2022 at 9:45am with Dr. Fredna Dow. Also, sent message to patients via mychart regarding appointment.

## 2022-05-18 NOTE — Telephone Encounter (Signed)
From referral:  Type Date User Summary Sent Status Attachment  General 05/15/2022  1:36 PM [place], Tosha L Referral Faxed Not sent with CE -  Note: Referral, clinical notes and copies of insurance cards have been faxed to [facility] [facility] at [phone]. Office will contact patient to schedule referral appointment.    Can you send elsewhere?

## 2022-05-18 NOTE — Telephone Encounter (Signed)
Mom came in stating where the referral is being sent they have no open availability for months. Can you send her somewhere where she can be seen ASAP she is a lot of pain.

## 2022-05-23 ENCOUNTER — Ambulatory Visit: Payer: 59 | Admitting: Sports Medicine

## 2022-05-23 ENCOUNTER — Ambulatory Visit (INDEPENDENT_AMBULATORY_CARE_PROVIDER_SITE_OTHER): Payer: 59

## 2022-05-23 DIAGNOSIS — M2550 Pain in unspecified joint: Secondary | ICD-10-CM

## 2022-05-23 DIAGNOSIS — G8929 Other chronic pain: Secondary | ICD-10-CM | POA: Insufficient documentation

## 2022-05-23 DIAGNOSIS — M7989 Other specified soft tissue disorders: Secondary | ICD-10-CM | POA: Diagnosis not present

## 2022-05-23 DIAGNOSIS — M79642 Pain in left hand: Secondary | ICD-10-CM | POA: Diagnosis not present

## 2022-05-23 MED ORDER — PREDNISONE 50 MG PO TABS: ORAL_TABLET | ORAL | 0 refills | Status: DC

## 2022-05-23 NOTE — Assessment & Plan Note (Signed)
Pleasant 22 year old female, she has had on and off swelling of her joints, historically her left third MCP, today her left second MCP is swollen, she does have mild palpable synovitis and it is visibly swollen. Otherwise good motion. She does have significant amounts of stiffness in the morning, and no history of trauma or proceeding viral syndrome. I am adding 5 days of prednisone, x-rays, rheumatoid workup. I like to see her back in about a month and if insufficient improvement we will consider an MRI.

## 2022-05-23 NOTE — Progress Notes (Signed)
    Procedures performed today:    None.  Independent interpretation of notes and tests performed by another provider:   None.  Brief History, Exam, Impression, and Recommendations:    Polyarthralgia Pleasant 22 year old female, she has had on and off swelling of her joints, historically her left third MCP, today her left second MCP is swollen, she does have mild palpable synovitis and it is visibly swollen. Otherwise good motion. She does have significant amounts of stiffness in the morning, and no history of trauma or proceeding viral syndrome. I am adding 5 days of prednisone, x-rays, rheumatoid workup. I like to see her back in about a month and if insufficient improvement we will consider an MRI.    ____________________________________________ Gwen Her. Dianah Field, M.D., ABFM., CAQSM., AME. Primary Care and Sports Medicine [facility]  Adjunct Professor of [employer] [facility] [employer]  Risk manager

## 2022-05-28 LAB — LUPUS(12) PANEL
Anti Nuclear Antibody (ANA): NEGATIVE
C3 Complement: 106 mg/dL (ref 83–193)
C4 Complement: 19 mg/dL (ref 15–57)
ENA SM Ab Ser-aCnc: <1 AI
Rheumatoid fact SerPl-aCnc: <14 IU/mL (ref <14)
Ribosomal P Protein Ab: <1 AI
SM/RNP: <1 AI
SSA (Ro) (ENA) Antibody, IgG: <1 AI
SSB (La) (ENA) Antibody, IgG: <1 AI
Scleroderma (Scl-70) (ENA) Antibody, IgG: <1 AI
Thyroperoxidase Ab SerPl-aCnc: 1 IU/mL (ref <9)
ds DNA Ab: <1 IU/mL

## 2022-05-28 LAB — COMPREHENSIVE METABOLIC PANEL
AG Ratio: 1.7 (calc) (ref 1.0–2.5)
ALT: 13 U/L (ref 6–29)
AST: 18 U/L (ref 10–30)
Albumin: 4.4 g/dL (ref 3.6–5.1)
Alkaline phosphatase (APISO): 59 U/L (ref 31–125)
BUN: 10 mg/dL (ref 7–25)
CO2: 24 mmol/L (ref 20–32)
Calcium: 10 mg/dL (ref 8.6–10.2)
Chloride: 108 mmol/L (ref 98–110)
Creat: 0.6 mg/dL (ref 0.50–0.96)
Globulin: 2.6 g/dL (calc) (ref 1.9–3.7)
Glucose, Bld: 68 mg/dL (ref 65–99)
Potassium: 4.1 mmol/L (ref 3.5–5.3)
Sodium: 142 mmol/L (ref 135–146)
Total Bilirubin: 0.5 mg/dL (ref 0.2–1.2)
Total Protein: 7 g/dL (ref 6.1–8.1)

## 2022-05-28 LAB — CK: Total CK: 171 U/L — ABNORMAL HIGH (ref 29–143)

## 2022-05-28 LAB — CBC
HCT: 43 % (ref 35.0–45.0)
Hemoglobin: 14.8 g/dL (ref 11.7–15.5)
MCH: 30.1 pg (ref 27.0–33.0)
MCHC: 34.4 g/dL (ref 32.0–36.0)
MCV: 87.4 fL (ref 80.0–100.0)
MPV: 11.3 fL (ref 7.5–12.5)
Platelets: 218 10*3/uL (ref 140–400)
RBC: 4.92 10*6/uL (ref 3.80–5.10)
RDW: 12.4 % (ref 11.0–15.0)
WBC: 5.4 10*3/uL (ref 3.8–10.8)

## 2022-05-28 LAB — SEDIMENTATION RATE: Sed Rate: 6 mm/h (ref 0–20)

## 2022-05-28 LAB — URIC ACID: Uric Acid, Serum: 3.6 mg/dL (ref 2.5–7.0)

## 2022-05-28 LAB — RHEUMATOID FACTOR (IGA, IGG, IGM)
Rheumatoid Factor (IgA): <5 U (ref <=6)
Rheumatoid Factor (IgG): <5 U (ref <=6)
Rheumatoid Factor (IgM): 5 U (ref <=6)

## 2022-05-28 LAB — CYCLIC CITRUL PEPTIDE ANTIBODY, IGG: Cyclic Citrullin Peptide Ab: <16 UNITS

## 2022-06-15 ENCOUNTER — Other Ambulatory Visit: Payer: Self-pay | Admitting: Physician Assistant

## 2022-06-15 DIAGNOSIS — J3089 Other allergic rhinitis: Secondary | ICD-10-CM

## 2022-06-15 DIAGNOSIS — R051 Acute cough: Secondary | ICD-10-CM

## 2022-06-23 ENCOUNTER — Ambulatory Visit: Payer: 59 | Admitting: Sports Medicine

## 2022-06-23 DIAGNOSIS — M79642 Pain in left hand: Secondary | ICD-10-CM

## 2022-06-23 DIAGNOSIS — G8929 Other chronic pain: Secondary | ICD-10-CM | POA: Diagnosis not present

## 2022-06-23 MED ORDER — PREGABALIN 25 MG PO CAPS: ORAL_CAPSULE | ORAL | 0 refills | Status: DC

## 2022-06-23 NOTE — Assessment & Plan Note (Signed)
Pleasant 22 year old female, swelling of her joints, historically left third MCP, she had mild palpable synovitis, it was visibly swollen, she had a lot of stiffness in the morning, we did a rheumatoid workup which was negative, added 5 days of prednisone, she does not report improvement with prednisone pointing away from juvenile or adult rheumatoid arthritis. She did have some dyspepsia but took prednisone without eating. Today she continues to have pain although exam is normal, swelling is gone, she has good motion, good strength. Proceed with MRI left hand, I do suspect there may be a myofascial component here so we will add low-dose Lyrica, she did not tolerate gabapentin in the past. Also adding formal physical therapy of the hands, return to see me in about 4 to 6 weeks.

## 2022-06-23 NOTE — Progress Notes (Signed)
    Procedures performed today:    None.  Independent interpretation of notes and tests performed by another provider:   None.  Brief History, Exam, Impression, and Recommendations:    Chronic hand pain, left Pleasant 22 year old female, swelling of her joints, historically left third MCP, she had mild palpable synovitis, it was visibly swollen, she had a lot of stiffness in the morning, we did a rheumatoid workup which was negative, added 5 days of prednisone, she does not report improvement with prednisone pointing away from juvenile or adult rheumatoid arthritis. She did have some dyspepsia but took prednisone without eating. Today she continues to have pain although exam is normal, swelling is gone, she has good motion, good strength. Proceed with MRI left hand, I do suspect there may be a myofascial component here so we will add low-dose Lyrica, she did not tolerate gabapentin in the past. Also adding formal physical therapy of the hands, return to see me in about 4 to 6 weeks.    ____________________________________________ Ihor Austin. Benjamin Stain, M.D., ABFM., CAQSM., AME. Primary Care and Sports Medicine [facility]  Adjunct Professor of Family Medicine  [employer] [facility] [employer]  Restaurant manager, fast food

## 2022-07-16 ENCOUNTER — Other Ambulatory Visit: Payer: Self-pay | Admitting: Physician Assistant

## 2022-07-16 DIAGNOSIS — M79645 Pain in left finger(s): Secondary | ICD-10-CM

## 2022-07-16 DIAGNOSIS — M545 Low back pain, unspecified: Secondary | ICD-10-CM

## 2022-07-16 DIAGNOSIS — M79642 Pain in left hand: Secondary | ICD-10-CM

## 2022-08-04 ENCOUNTER — Ambulatory Visit: Payer: 59 | Admitting: Sports Medicine

## 2022-08-21 ENCOUNTER — Ambulatory Visit: Payer: 59 | Admitting: Physician Assistant

## 2022-08-25 ENCOUNTER — Encounter: Payer: Self-pay | Admitting: Physician Assistant

## 2022-08-25 ENCOUNTER — Ambulatory Visit: Payer: 59 | Admitting: Physician Assistant

## 2022-08-25 VITALS — BP 108/66 | HR 102 | Ht 68.0 in | Wt 152.0 lb

## 2022-08-25 DIAGNOSIS — T7840XD Allergy, unspecified, subsequent encounter: Secondary | ICD-10-CM | POA: Diagnosis not present

## 2022-08-25 DIAGNOSIS — F419 Anxiety disorder, unspecified: Secondary | ICD-10-CM

## 2022-08-25 DIAGNOSIS — R0602 Shortness of breath: Secondary | ICD-10-CM | POA: Diagnosis not present

## 2022-08-25 DIAGNOSIS — Z09 Encounter for follow-up examination after completed treatment for conditions other than malignant neoplasm: Secondary | ICD-10-CM

## 2022-08-25 DIAGNOSIS — R0789 Other chest pain: Secondary | ICD-10-CM | POA: Diagnosis not present

## 2022-08-25 MED ORDER — EPINEPHRINE 0.3 MG/0.3ML IJ SOAJ
0.3000 mg | INTRAMUSCULAR | 1 refills | Status: AC | PRN
Start: 2022-08-25 — End: ?

## 2022-08-25 NOTE — Progress Notes (Unsigned)
   Established Patient Office Visit  Subjective   [name], female    DOB: 03/24/2000  Age: 22 y.o. MRN: [id number]  Chief Complaint  Patient presents with   Follow-up    Emergency Room Visit    HPI Solumedrol and benadryl  coconut   .Marland Kitchen Active Ambulatory Problems    Diagnosis Date Noted   Abdominal cramping 05/24/2017   Bruising 05/24/2017   Anhedonia 05/24/2017   Moderate episode of recurrent major depressive disorder (HCC) 05/24/2017   Multiple food allergies 05/28/2017   Social anxiety disorder 07/24/2017   Weight gain 12/02/2018   Mood swings 12/02/2018   Post concussion syndrome 12/02/2018   Insomnia due to other mental disorder 02/09/2019   Irritable bowel syndrome with both constipation and diarrhea 02/09/2019   Anxiety 02/09/2019   Breast hypertrophy in female 07/06/2019   Pancreatitis, gallstone 01/14/2018   Allergic reaction 08/13/2019   Allergic urticaria 08/13/2019   Chronic rhinitis 08/13/2019   Lumbarization, vertebra 12/12/2019   Acute bilateral low back pain without sciatica 12/12/2019   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    Objective:     BP 108/66 (BP Location: Left Arm, Patient Position: Sitting, Cuff Size: Normal)   Pulse (!) 102   Ht 5\' 8"  (1.727 m)   Wt 152 lb (68.9 kg)   SpO2 99%   BMI 23.11 kg/m  BP Readings from Last 3 Encounters:  08/25/22 108/66  05/12/22 123/82  08/26/21 115/84   Wt Readings from Last 3 Encounters:  08/25/22 152 lb (68.9 kg)  05/12/22 156 lb (70.8 kg)  02/07/21 180 lb (81.6 kg) (94 %, Z= 1.59)*   * Growth percentiles are based on CDC (Girls, 2-20 Years)  data.      Physical Exam      Assessment & Plan:    No follow-ups on file.    Tandy Gaw, PA-C

## 2022-08-28 ENCOUNTER — Encounter: Payer: Self-pay | Admitting: Physician Assistant

## 2022-08-28 DIAGNOSIS — R0789 Other chest pain: Secondary | ICD-10-CM | POA: Insufficient documentation

## 2022-08-28 DIAGNOSIS — R0602 Shortness of breath: Secondary | ICD-10-CM | POA: Insufficient documentation

## 2022-09-09 ENCOUNTER — Other Ambulatory Visit: Payer: Self-pay | Admitting: Physician Assistant

## 2022-09-09 ENCOUNTER — Other Ambulatory Visit: Payer: Self-pay | Admitting: Family Medicine

## 2022-09-09 DIAGNOSIS — M79645 Pain in left finger(s): Secondary | ICD-10-CM

## 2022-09-09 DIAGNOSIS — J3089 Other allergic rhinitis: Secondary | ICD-10-CM

## 2022-09-09 DIAGNOSIS — M545 Low back pain, unspecified: Secondary | ICD-10-CM

## 2022-09-09 DIAGNOSIS — M79642 Pain in left hand: Secondary | ICD-10-CM

## 2022-09-09 DIAGNOSIS — R051 Acute cough: Secondary | ICD-10-CM

## 2022-09-23 IMAGING — DX DG LUMBAR SPINE COMPLETE 4+V
5 series · 5 of 5 positions shown · non-contrast
Comparison: None.

CLINICAL DATA: Low back pain for 2 weeks

EXAM:
LUMBAR SPINE - COMPLETE 4+ VIEW

[l-spine ap]
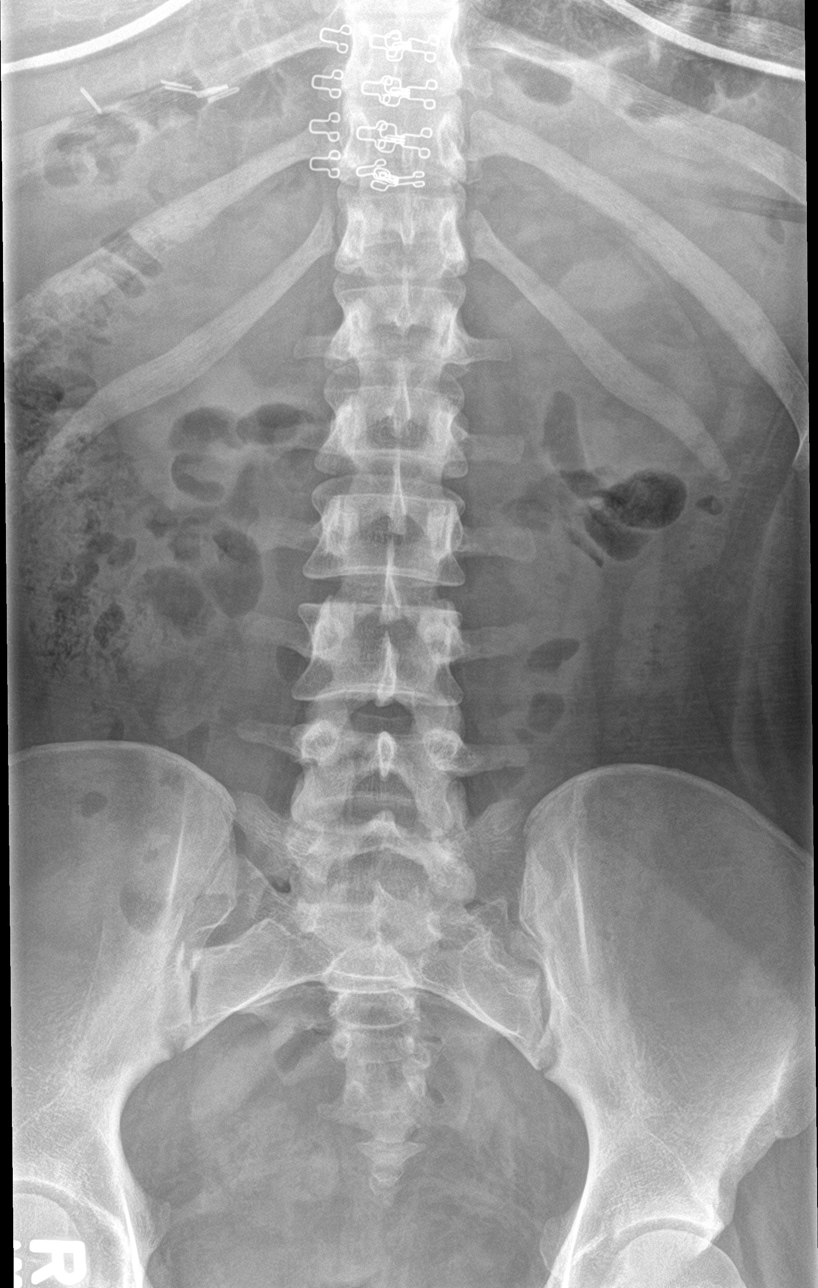

[l-spine obl (1 of 2)]
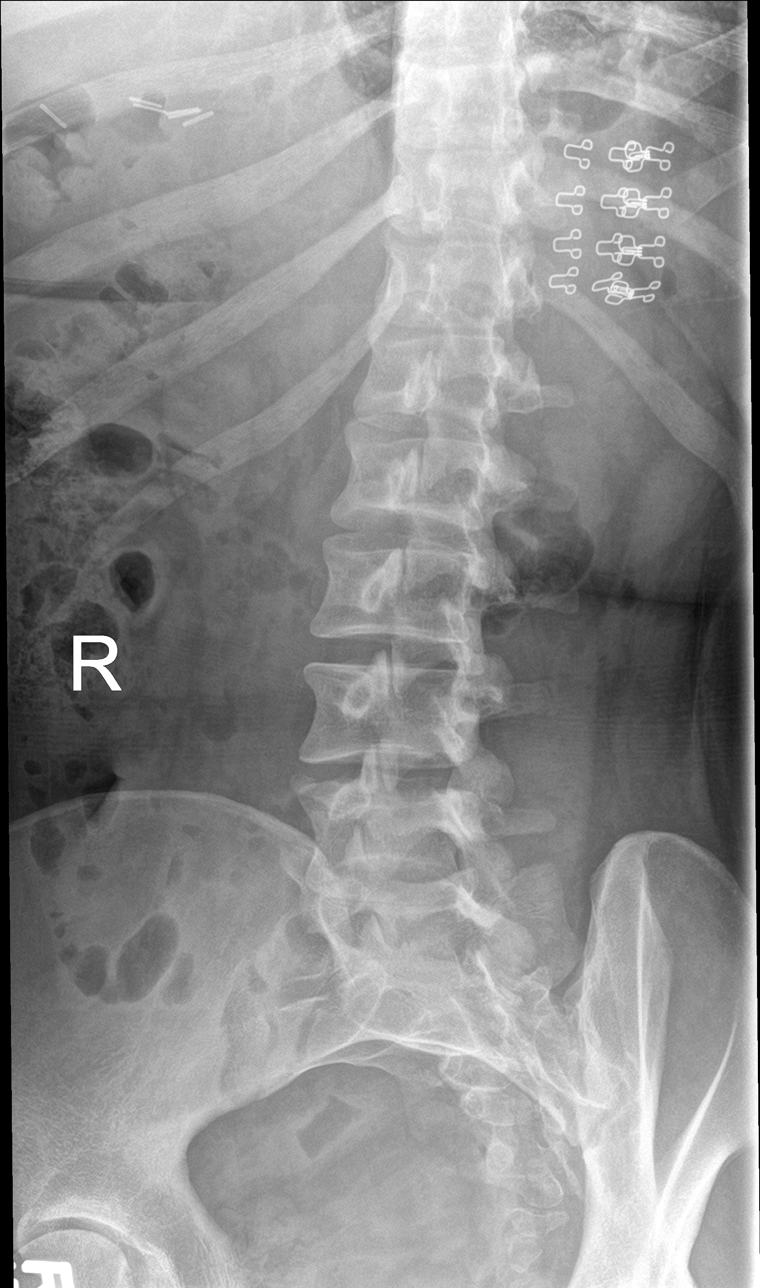

[l-spine obl (2 of 2)]
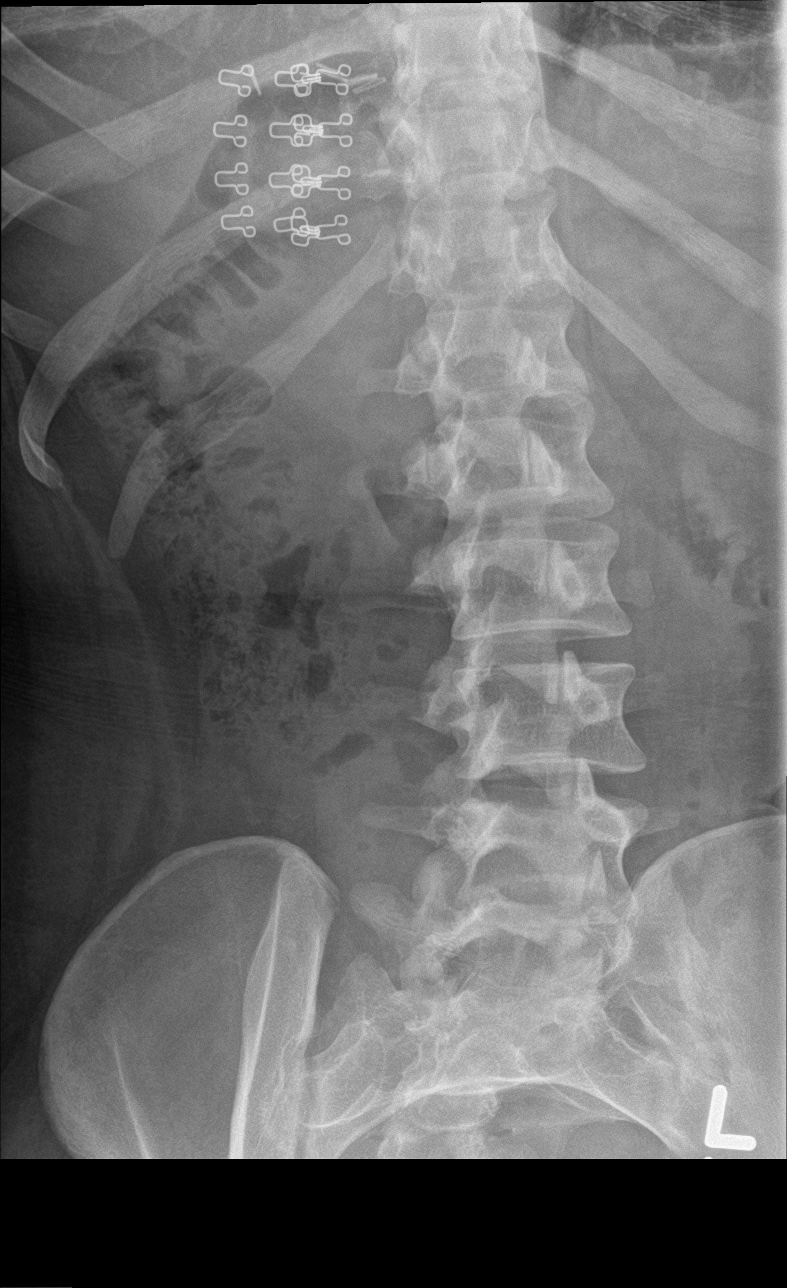

[l-spine lat]
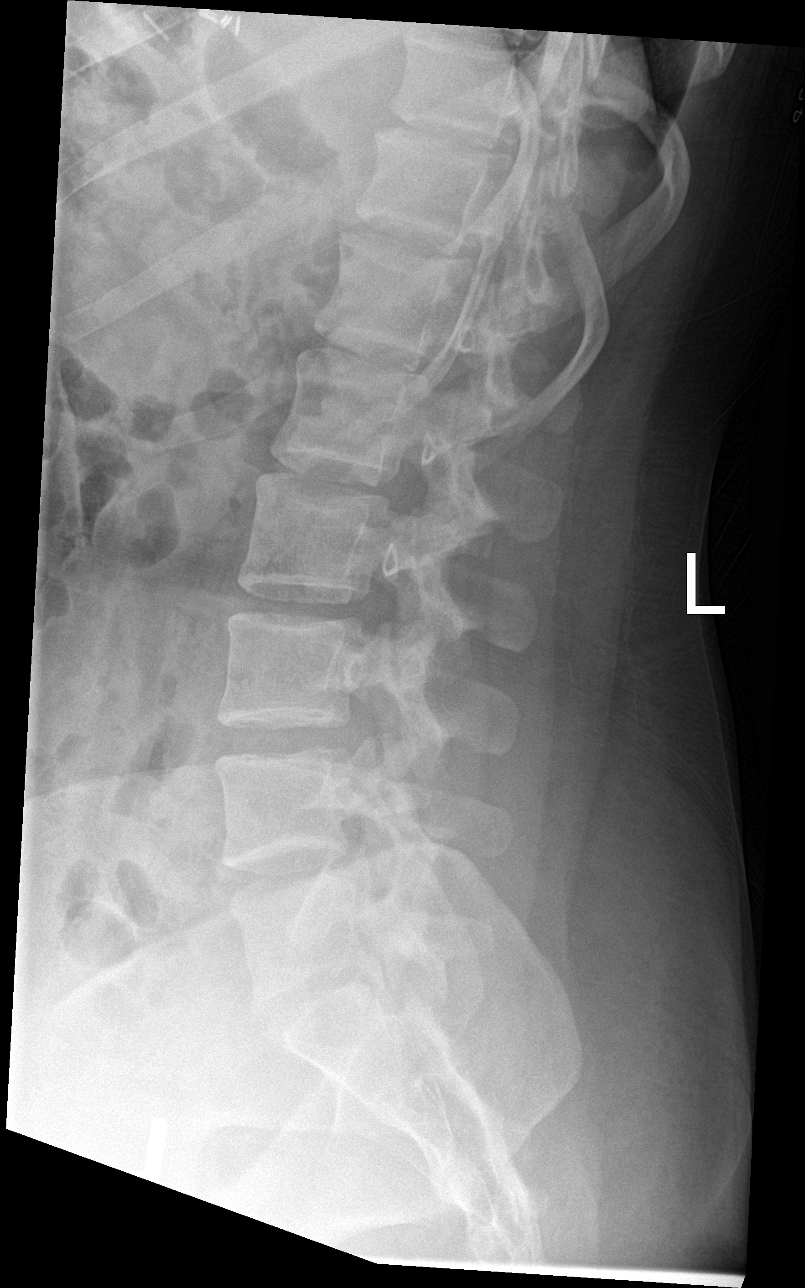

[l-spine spot]
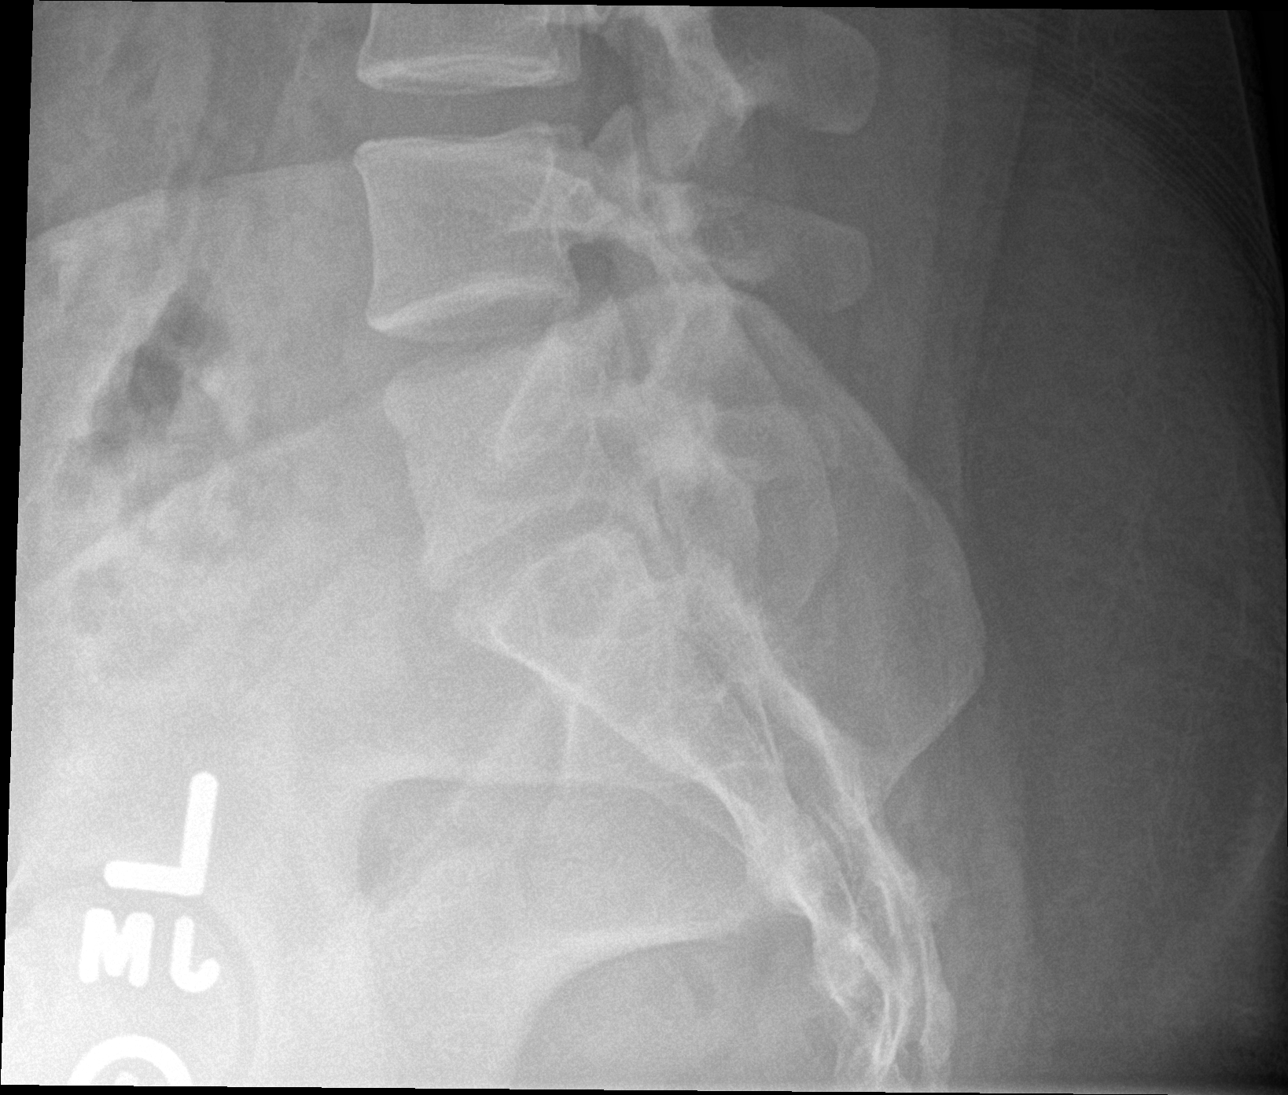

[5 of 5 positions shown; findings below may reference images not displayed]

FINDINGS: Suspect lumbosacral transitional anatomy with lumbarization of the
S1 segment. There is no evidence of lumbar spine fracture. Alignment
is normal. Intervertebral disc spaces are maintained. Unremarkable
facet joints.
IMPRESSION: Negative.

## 2022-11-13 ENCOUNTER — Other Ambulatory Visit: Payer: Self-pay | Admitting: Physician Assistant

## 2022-11-13 DIAGNOSIS — M79645 Pain in left finger(s): Secondary | ICD-10-CM

## 2022-11-13 DIAGNOSIS — M545 Low back pain, unspecified: Secondary | ICD-10-CM

## 2022-11-13 DIAGNOSIS — M79642 Pain in left hand: Secondary | ICD-10-CM

## 2022-12-14 ENCOUNTER — Other Ambulatory Visit: Payer: Self-pay | Admitting: Physician Assistant

## 2022-12-14 ENCOUNTER — Telehealth: Payer: 59 | Admitting: Physician Assistant

## 2022-12-14 DIAGNOSIS — J3089 Other allergic rhinitis: Secondary | ICD-10-CM

## 2022-12-14 DIAGNOSIS — B9789 Other viral agents as the cause of diseases classified elsewhere: Secondary | ICD-10-CM | POA: Diagnosis not present

## 2022-12-14 DIAGNOSIS — R051 Acute cough: Secondary | ICD-10-CM

## 2022-12-14 DIAGNOSIS — J019 Acute sinusitis, unspecified: Secondary | ICD-10-CM

## 2022-12-15 MED ORDER — AZELASTINE HCL 0.1 % NA SOLN
1.0000 | Freq: Two times a day (BID) | NASAL | 0 refills | Status: AC
Start: 2022-12-15 — End: ?

## 2022-12-15 MED ORDER — PSEUDOEPH-BROMPHEN-DM 30-2-10 MG/5ML PO SYRP
5.0000 mL | ORAL_SOLUTION | Freq: Four times a day (QID) | ORAL | 0 refills | Status: DC | PRN
Start: 2022-12-15 — End: 2023-02-21

## 2022-12-15 NOTE — Progress Notes (Signed)
E-Visit for [employer] Virus / COVID Screening  Your current symptoms could be consistent with COVID.  Please complete a Covid test either at home or check with your local pharmacy to see if they provide testing.    You have tested positive for COVID-19, meaning that you were infected with the novel coronavirus and could give the virus to others.  Most people with COVID-19 have mild illness and can recover at home without medical care. Do not leave your home, except to get medical care. Do not visit public areas and do not go to places where you are unable to wear a mask. It is important that you stay home  to take care for yourself and to help protect other people in your home and community.      Isolation Instructions:   You are to isolate at home until you have been fever free for at least 24 hours without a fever-reducing medication, and symptoms have been steadily improving for 24 hours. At that time,  you can end isolation but need to mask for an additional 5 days.  If you must be around other household members who do not have symptoms, you need to make sure that both you and the family members are masking consistently with a high-quality mask.  If you note any worsening of symptoms despite treatment, please seek an in-person evaluation ASAP. If you note any significant shortness of breath or any chest pain, please seek ER evaluation. Please do not delay care!  Go to the nearest hospital ED for assessment if fever/cough/breathlessness are severe or illness seems like a threat to life.    The following symptoms may appear 2-14 days after exposure: Fever Cough Shortness of breath or difficulty breathing Chills Repeated shaking with chills Muscle pain Headache Sore throat New loss of taste or smell Fatigue Congestion or runny nose Nausea or vomiting Diarrhea  You can use medication such as prescription for Azelastine nasal spray 2 sprays in each nostril twice per day (can be used with  Fluticasone/Flonase you are already using), and Bromfed DM cough syrup Take 5mL every 6 hours as needed for cough and congestion.   You may also take acetaminophen (Tylenol) as needed for fever.  HOME CARE Only take medications as instructed by your medical team. Drink plenty of fluids and get plenty of rest. A steam or ultrasonic humidifier can help if you have congestion.  GET HELP RIGHT AWAY IF YOU HAVE EMERGENCY WARNING SIGNS.  Call 911 or proceed to your closest emergency facility if: You develop worsening high fever. Trouble breathing Bluish lips or face Persistent pain or pressure in the chest New confusion Inability to wake or stay awake You cough up blood. Your symptoms become more severe Inability to hold down food or fluids  This list is not all possible symptoms. Contact your medical provider for any symptoms that are severe or concerning to you.   Your e-visit answers were reviewed by a board certified advanced clinical practitioner to complete your personal care plan.  Depending on the condition, your plan could have included both over the counter or prescription medications.  If there is a problem, please reply once you have received a response from your provider.  Your safety is important to [place].  If you have drug allergies check your prescription carefully.    You can use MyChart to ask questions about today's visit, request a non-urgent call back, or ask for a work or school excuse for 24 hours related  to this e-Visit. If it has been greater than 24 hours you will need to follow up with your provider or enter a new e-Visit to address those concerns. You will get an e-mail in the next two days asking about your experience.  I hope that your e-visit has been valuable and will speed your recovery. Thank you for using e-visits.    I have spent 5 minutes in review of e-visit questionnaire, review and updating patient chart, medical decision making and response to patient.    Margaretann Loveless, PA-C

## 2022-12-29 ENCOUNTER — Ambulatory Visit (INDEPENDENT_AMBULATORY_CARE_PROVIDER_SITE_OTHER): Payer: 59 | Admitting: Physician Assistant

## 2022-12-29 ENCOUNTER — Encounter: Payer: Self-pay | Admitting: Physician Assistant

## 2022-12-29 VITALS — BP 118/59 | HR 97 | Ht 68.0 in | Wt 152.0 lb

## 2022-12-29 DIAGNOSIS — J0140 Acute pansinusitis, unspecified: Secondary | ICD-10-CM

## 2022-12-29 MED ORDER — AZITHROMYCIN 250 MG PO TABS
ORAL_TABLET | ORAL | 0 refills | Status: DC
Start: 2022-12-29 — End: 2023-02-21

## 2022-12-29 NOTE — Patient Instructions (Signed)

## 2022-12-29 NOTE — Progress Notes (Signed)
Acute Office Visit  Subjective:     [name], female    DOB: [birth year], 22 y.o.   MRN: [id number]  Chief Complaint  Patient presents with   Cough    HPI Patient is in today for sinus pressure, congestion, drainage and cough for over 2 weeks.  She has not been tested for covid or flu. She saw virtual provider 10/3 and was given symptomatic care for viral URI. She has been using flonase, allergy medications, decongestants with no benefit. Pt has history of sinusitis. Her symptoms have not improved.   .. Active Ambulatory Problems    Diagnosis Date Noted   Abdominal cramping 05/24/2017   Bruising 05/24/2017   Anhedonia 05/24/2017   Moderate episode of recurrent major depressive disorder (HCC) 05/24/2017   Multiple food allergies 05/28/2017   Social anxiety disorder 07/24/2017   Weight gain 12/02/2018   Mood swings 12/02/2018   Post concussion syndrome 12/02/2018   Insomnia due to other mental disorder 02/09/2019   Irritable bowel syndrome with both constipation and diarrhea 02/09/2019   Anxiety 02/09/2019   Breast hypertrophy in female 07/06/2019   Pancreatitis, gallstone 01/14/2018   Allergic reaction 08/13/2019   Allergic urticaria 08/13/2019   Chronic rhinitis 08/13/2019   Lumbarization, vertebra 12/12/2019   Acute bilateral low back pain without sciatica 12/12/2019   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      Objective:    BP (!) 118/59   Pulse 97   Ht 5\' 8"  (1.727 m)   Wt 152 lb (68.9 kg)   SpO2 99%   BMI 23.11 kg/m  BP Readings from Last 3  Encounters:  12/29/22 (!) 118/59  08/25/22 108/66  05/12/22 123/82   Wt Readings from Last 3 Encounters:  12/29/22 152 lb (68.9 kg)  08/25/22 152 lb (68.9 kg)  05/12/22 156 lb (70.8 kg)      Physical Exam Constitutional:      Appearance: Normal appearance.  HENT:     Head: Normocephalic.     Comments: Tenderness to palpation over maxillary sinuses    Right Ear: Tympanic membrane, ear canal and external ear normal. There is no impacted cerumen.     Left Ear: Tympanic membrane, ear canal and external ear normal. There is no impacted cerumen.     Nose: Congestion present.     Mouth/Throat:     Mouth: Mucous membranes are moist.     Pharynx: Posterior oropharyngeal erythema present.  Eyes:     General:        Right eye: No discharge.        Left eye: No discharge.  Cardiovascular:     Rate and Rhythm: Normal rate and regular rhythm.  Pulmonary:     Effort: Pulmonary effort is normal.     Breath sounds: Normal breath sounds.  Musculoskeletal:     Cervical back: Normal range of motion and neck supple.     Right lower leg: No edema.     Left lower leg: No edema.  Lymphadenopathy:     Cervical: Cervical adenopathy present.  Neurological:     General: No focal deficit present.     Mental Status: She is alert and oriented to person, place, and time.  Psychiatric:        Mood and Affect: Mood normal.          Assessment & Plan:  .Marland KitchenAlexea was seen today for cough.  Diagnoses and all orders for this visit:  Acute non-recurrent pansinusitis -     azithromycin (ZITHROMAX Z-PAK) 250 MG tablet; Take 2 tablets (500 mg) on  Day 1,  followed by 1 tablet (250 mg) once daily on Days 2 through 5.   Zpak for sinusitis Continue flonase Rest and hydrate Lungs and vitals reassuring today Follow up as needed or if symptoms persist.    Tandy Gaw, PA-C

## 2023-01-10 ENCOUNTER — Other Ambulatory Visit: Payer: Self-pay | Admitting: Physician Assistant

## 2023-01-10 DIAGNOSIS — M79642 Pain in left hand: Secondary | ICD-10-CM

## 2023-01-10 DIAGNOSIS — M545 Low back pain, unspecified: Secondary | ICD-10-CM

## 2023-01-10 DIAGNOSIS — M79645 Pain in left finger(s): Secondary | ICD-10-CM

## 2023-02-21 ENCOUNTER — Telehealth: Payer: 59 | Admitting: Physician Assistant

## 2023-02-21 ENCOUNTER — Encounter: Payer: Self-pay | Admitting: Physician Assistant

## 2023-02-21 VITALS — Ht 67.0 in | Wt 145.0 lb

## 2023-02-21 DIAGNOSIS — J0140 Acute pansinusitis, unspecified: Secondary | ICD-10-CM

## 2023-02-21 DIAGNOSIS — J019 Acute sinusitis, unspecified: Secondary | ICD-10-CM

## 2023-02-21 DIAGNOSIS — J069 Acute upper respiratory infection, unspecified: Secondary | ICD-10-CM | POA: Diagnosis not present

## 2023-02-21 DIAGNOSIS — B9789 Other viral agents as the cause of diseases classified elsewhere: Secondary | ICD-10-CM | POA: Diagnosis not present

## 2023-02-21 MED ORDER — PSEUDOEPH-BROMPHEN-DM 30-2-10 MG/5ML PO SYRP
5.0000 mL | ORAL_SOLUTION | Freq: Four times a day (QID) | ORAL | 0 refills | Status: DC | PRN
Start: 2023-02-21 — End: 2023-04-23

## 2023-02-21 MED ORDER — AZITHROMYCIN 250 MG PO TABS
ORAL_TABLET | ORAL | 0 refills | Status: DC
Start: 2023-02-21 — End: 2023-04-23

## 2023-02-21 MED ORDER — PREDNISONE 20 MG PO TABS
ORAL_TABLET | ORAL | 0 refills | Status: DC
Start: 2023-02-21 — End: 2023-04-23

## 2023-02-21 NOTE — Progress Notes (Signed)
..  Virtual Visit via Video Note  I connected with Laura Pineda on 02/21/23 at  2:20 PM EST by a video enabled telemedicine application and verified that I am speaking with the correct person using two identifiers.  Location: Patient: home Provider: clinic  .Marland KitchenParticipating in visit:  Patient: Laura Pineda Provider: Tandy Gaw PA-C Provider in training: Carlyle Dolly PA-S   I discussed the limitations of evaluation and management by telemedicine and the availability of in person appointments. The patient expressed understanding and agreed to proceed.  History of Present Illness: Pt is a 22 yo female who calls into the clinic with URI symptoms for last 5 days. She denies any fever, body aches, problems breathing. She is taking her allergy medication and benadryl with little benefit. She has a dry to productive cough. She has lots of sinus congestion. She is blowing out green/yellow discharge.     Observations/Objective: No acute distress Congested Normal breathing   Assessment and Plan: .Marland KitchenFruma was seen today for sinusitis.  Diagnoses and all orders for this visit:  Viral upper respiratory tract infection -     brompheniramine-pseudoephedrine-DM 30-2-10 MG/5ML syrup; Take 5 mLs by mouth 4 (four) times daily as needed. -     predniSONE (DELTASONE) 20 MG tablet; Take one tablet for 5 days. -     azithromycin (ZITHROMAX Z-PAK) 250 MG tablet; Take 2 tablets (500 mg) on  Day 1,  followed by 1 tablet (250 mg) once daily on Days 2 through 5.  Acute viral sinusitis -     brompheniramine-pseudoephedrine-DM 30-2-10 MG/5ML syrup; Take 5 mLs by mouth 4 (four) times daily as needed. -     predniSONE (DELTASONE) 20 MG tablet; Take one tablet for 5 days. -     azithromycin (ZITHROMAX Z-PAK) 250 MG tablet; Take 2 tablets (500 mg) on  Day 1,  followed by 1 tablet (250 mg) once daily on Days 2 through 5.  Acute non-recurrent pansinusitis -     brompheniramine-pseudoephedrine-DM 30-2-10 MG/5ML syrup; Take 5  mLs by mouth 4 (four) times daily as needed. -     predniSONE (DELTASONE) 20 MG tablet; Take one tablet for 5 days. -     azithromycin (ZITHROMAX Z-PAK) 250 MG tablet; Take 2 tablets (500 mg) on  Day 1,  followed by 1 tablet (250 mg) once daily on Days 2 through 5.    Almost a week of symptoms Appears stable Continue with symptomatic care Sent cough syrup, prednisone and zpak if not improving in next 2-3 days Rest and hydrate Follow up as needed or if symptoms persist or worsens.   Follow Up Instructions:    I discussed the assessment and treatment plan with the patient. The patient was provided an opportunity to ask questions and all were answered. The patient agreed with the plan and demonstrated an understanding of the instructions.   The patient was advised to call back or seek an in-person evaluation if the symptoms worsen or if the condition fails to improve as anticipated.   Tandy Gaw, PA-C

## 2023-03-08 ENCOUNTER — Other Ambulatory Visit: Payer: Self-pay | Admitting: Physician Assistant

## 2023-03-08 DIAGNOSIS — M79642 Pain in left hand: Secondary | ICD-10-CM

## 2023-03-08 DIAGNOSIS — M79645 Pain in left finger(s): Secondary | ICD-10-CM

## 2023-03-08 DIAGNOSIS — M545 Low back pain, unspecified: Secondary | ICD-10-CM

## 2023-03-20 ENCOUNTER — Other Ambulatory Visit: Payer: Self-pay | Admitting: Physician Assistant

## 2023-03-20 DIAGNOSIS — J3089 Other allergic rhinitis: Secondary | ICD-10-CM

## 2023-03-20 DIAGNOSIS — R051 Acute cough: Secondary | ICD-10-CM

## 2023-04-23 ENCOUNTER — Ambulatory Visit: Payer: 59 | Admitting: Physician Assistant

## 2023-04-23 VITALS — BP 122/72 | HR 78

## 2023-04-23 DIAGNOSIS — L03039 Cellulitis of unspecified toe: Secondary | ICD-10-CM

## 2023-04-23 DIAGNOSIS — S99929A Unspecified injury of unspecified foot, initial encounter: Secondary | ICD-10-CM | POA: Diagnosis not present

## 2023-04-23 MED ORDER — DOXYCYCLINE HYCLATE 100 MG PO TABS: 100.0000 mg | ORAL_TABLET | Freq: Two times a day (BID) | ORAL | 0 refills | Status: DC

## 2023-04-23 NOTE — Progress Notes (Signed)
   Established Patient Office Visit  Subjective   [name], female    DOB: Apr 14, 2000  Age: 23 y.o. MRN: [id number]  No chief complaint on file.   HPI Pt is a 23 yo female who presents to the clinic with toenail changes for the last 2 months. She has a job working with groomer and dogs. She has frequent encounters with big dogs stepping on her toes and having her feet stay wet. She wears crocs with socks. She is having her great toe separate and ooze underneath. No pain. Multiple toenails appear bruised. She has had a few pedicures but no other treatment.      .. Active Ambulatory Problems    Diagnosis Date Noted   Abdominal cramping 05/24/2017   Bruising 05/24/2017   Anhedonia 05/24/2017   Moderate episode of recurrent major depressive disorder (HCC) 05/24/2017   Multiple food allergies 05/28/2017   Social anxiety disorder 07/24/2017   Weight gain 12/02/2018   Mood swings 12/02/2018   Post concussion syndrome 12/02/2018   Insomnia due to other mental disorder 02/09/2019   Irritable bowel syndrome with both constipation and diarrhea 02/09/2019   Anxiety 02/09/2019   Breast hypertrophy in female 07/06/2019   Pancreatitis, gallstone 01/14/2018   Allergic reaction 08/13/2019   Allergic urticaria 08/13/2019   Chronic rhinitis 08/13/2019   Lumbarization, vertebra 12/12/2019   Acute bilateral low back pain without sciatica 12/12/2019   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    Objective:     BP 122/72 (BP Location: Right Arm, Patient Position: Sitting, Cuff  Size: Normal)   Pulse 78  BP Readings from Last 3 Encounters:  04/23/23 122/72  12/29/22 (!) 118/59  08/25/22 108/66   Wt Readings from Last 3 Encounters:  02/21/23 145 lb (65.8 kg)  12/29/22 152 lb (68.9 kg)  08/25/22 152 lb (68.9 kg)      Physical Exam Multiple toenails with appearance of bruising under nail bed.  Bilateral great toenail lifted with brownish crusted appearance under both nails Nail stable and not falling off.  Toenail is not thick or dystrophic  Assessment & Plan:  Marland KitchenMarland KitchenDiagnoses and all orders for this visit:  Infection of toenail -     doxycycline (VIBRA-TABS) 100 MG tablet; Take 1 tablet (100 mg total) by mouth 2 (two) times daily.  Injury of great toe, initial encounter   Likely trauma to toes is causing this and perhaps her feet staying wet is not helping  Suggested rain boots with protective thick toed area like HUNTER or SPERRY boots Start doxycycline for infection Soak in epson salt 2-3 times a week Follow up as needed    Tandy Gaw, PA-C

## 2023-04-24 ENCOUNTER — Encounter: Payer: Self-pay | Admitting: Physician Assistant

## 2023-05-18 ENCOUNTER — Other Ambulatory Visit: Payer: Self-pay | Admitting: Physician Assistant

## 2023-05-18 DIAGNOSIS — M79645 Pain in left finger(s): Secondary | ICD-10-CM

## 2023-05-18 DIAGNOSIS — M545 Low back pain, unspecified: Secondary | ICD-10-CM

## 2023-05-18 DIAGNOSIS — M79642 Pain in left hand: Secondary | ICD-10-CM

## 2023-06-26 ENCOUNTER — Other Ambulatory Visit: Payer: Self-pay | Admitting: Physician Assistant

## 2023-06-26 DIAGNOSIS — R051 Acute cough: Secondary | ICD-10-CM

## 2023-06-26 DIAGNOSIS — J3089 Other allergic rhinitis: Secondary | ICD-10-CM

## 2023-10-03 ENCOUNTER — Ambulatory Visit: Admitting: Family Medicine

## 2023-10-03 ENCOUNTER — Ambulatory Visit: Payer: Self-pay

## 2023-10-03 ENCOUNTER — Encounter: Payer: Self-pay | Admitting: Family Medicine

## 2023-10-03 VITALS — BP 119/68 | HR 80 | Temp 98.4°F | Ht 68.0 in | Wt 142.0 lb

## 2023-10-03 DIAGNOSIS — J3089 Other allergic rhinitis: Secondary | ICD-10-CM

## 2023-10-03 DIAGNOSIS — J0100 Acute maxillary sinusitis, unspecified: Secondary | ICD-10-CM

## 2023-10-03 MED ORDER — DOXYCYCLINE HYCLATE 100 MG PO TABS
100.0000 mg | ORAL_TABLET | Freq: Two times a day (BID) | ORAL | 0 refills | Status: AC
Start: 2023-10-03 — End: ?

## 2023-10-03 NOTE — Assessment & Plan Note (Signed)
 Recommend changes flonase  to daily and use oral antihistamine PRN. Discussed that nasal sprays on are now first line and have been shown to reduce freq of sinus infections.

## 2023-10-03 NOTE — Progress Notes (Signed)
   Acute Office Visit  Subjective:     [name], female    DOB: 25-Jan-2001, 23 y.o.   MRN: [id number]  Chief Complaint  Patient presents with   Sinusitis    HPI Patient is in today for 2 weeks had nasal congestion, pain and pressure.  Last few days has been more tired, more post nasal drip, achy. Was around someone sick over weekend.  Pressure over her maxillary sinuses and mid forehead.  Eyes are more swollen today. No fever or chills.    ROS      Objective:    BP 119/68   Pulse 80   Temp 98.4 F (36.9 C) (Oral)   Ht 5' 8 (1.727 m)   Wt 142 lb (64.4 kg)   SpO2 100%   BMI 21.59 kg/m    Physical Exam Constitutional:      Appearance: Normal appearance.  HENT:     Head: Normocephalic and atraumatic.     Right Ear: Ear canal and external ear normal. There is no impacted cerumen.     Left Ear: Tympanic membrane, ear canal and external ear normal. There is no impacted cerumen.     Ears:     Comments: Right TM mildly erythematous.     Nose: Nose normal.     Mouth/Throat:     Pharynx: Oropharynx is clear.  Eyes:     Conjunctiva/sclera: Conjunctivae normal.  Cardiovascular:     Rate and Rhythm: Normal rate and regular rhythm.  Pulmonary:     Effort: Pulmonary effort is normal.     Breath sounds: Normal breath sounds.  Musculoskeletal:     Cervical back: Neck supple. No tenderness.  Lymphadenopathy:     Cervical: No cervical adenopathy.  Skin:    General: Skin is warm and dry.  Neurological:     Mental Status: She is alert and oriented to person, place, and time.  Psychiatric:        Mood and Affect: Mood normal.     No results found for any visits on 10/03/23.      Assessment & Plan:   Problem List Items Addressed This Visit       Respiratory   Non-seasonal allergic rhinitis   Recommend changes flonase  to daily and use oral antihistamine PRN. Discussed that nasal sprays on are now first line and have been shown to reduce freq of sinus  infections.        Other Visit Diagnoses       Acute non-recurrent maxillary sinusitis    -  Primary   Relevant Medications   doxycycline  (VIBRA -TABS) 100 MG tablet      Sinusitis - will tx with doxy.  PCN allergy . Call I not better in one week.   Meds ordered this encounter  Medications   doxycycline  (VIBRA -TABS) 100 MG tablet    Sig: Take 1 tablet (100 mg total) by mouth 2 (two) times daily.    Dispense:  14 tablet    Refill:  0    Return if symptoms worsen or fail to improve.  Dorothyann Byars, MD

## 2023-10-03 NOTE — Telephone Encounter (Signed)
 FYI Only or Action Required?: FYI only for provider.  Patient was last seen in primary care on 04/23/2023 by Antoniette Vermell CROME, PA-C.  Called Nurse Triage reporting Generalized Body Aches and Sinusitis.  Symptoms began several weeks ago.  Interventions attempted: OTC medications: [facility] allergy  relief medicine.  Symptoms are: sinus pain, nasal drainage, bilateral ear aches (2 weeks ago), body aches, irritated throat, stomach painunchanged.  Triage Disposition: See Physician Within 24 Hours  Patient/caregiver understands and will follow disposition?: Yes            Copied from CRM #[id number]. Topic: Clinical - Red Word Triage >> Oct 03, 2023  8:04 AM Laurier BROCKS wrote: Red Word that prompted transfer to Nurse Triage: Patient is complaining of the following: body aches, pressure in face, with draining and fatigue. The symptoms have been going on for 2 weeks. Reason for Disposition  Earache  Answer Assessment - Initial Assessment Questions Mother, susan, on the phone for triage. She states the patient has gone back to bed and is not available for triage.  1. LOCATION: Where does it hurt?      Between eyes and near cheeks.  2. ONSET: When did the sinus pain start?  (e.g., hours, days)      X 2 weeks.  3. SEVERITY: How bad is the pain?   (Scale 0-10; or none, mild, moderate or severe)     Moderate to severe, had to call out of work today.  4. RECURRENT SYMPTOM: Have you ever had sinus problems before? If Yes, ask: When was the last time? and What happened that time?      Yes, patient suffers from allergies. Per mother, every once in a while patient gets sinus infections.  5. NASAL CONGESTION: Is the nose blocked? If Yes, ask: Can you open it or must you breathe through your mouth?     No.  6. NASAL DISCHARGE: Do you have discharge from your nose? If so ask, What color?     Yes, clear and white.  7. FEVER: Do you have a fever? If Yes, ask: What is it,  how was it measured, and when did it start?      No.  8. OTHER SYMPTOMS: Do you have any other symptoms? (e.g., sore throat, cough, earache, difficulty breathing)     Body aches, irritated throat, bilateral ear pain (2 weeks ago), stomach pain guessing from all the drainage (not severe or constant). Mother denies labored breathing, severe difficulty breathing.  9. PREGNANCY: Is there any chance you are pregnant? When was your last menstrual period?     No pregnancy, LMP 2 weeks ago.  Protocols used: Sinus Pain or Congestion-A-AH

## 2023-10-16 ENCOUNTER — Other Ambulatory Visit: Payer: Self-pay | Admitting: Physician Assistant

## 2023-10-16 DIAGNOSIS — R051 Acute cough: Secondary | ICD-10-CM

## 2023-10-16 DIAGNOSIS — J3089 Other allergic rhinitis: Secondary | ICD-10-CM

## 2023-11-13 ENCOUNTER — Encounter: Payer: Self-pay | Admitting: Sports Medicine

## 2023-12-02 ENCOUNTER — Other Ambulatory Visit: Payer: Self-pay | Admitting: Physician Assistant

## 2023-12-02 DIAGNOSIS — M79645 Pain in left finger(s): Secondary | ICD-10-CM

## 2023-12-02 DIAGNOSIS — M79642 Pain in left hand: Secondary | ICD-10-CM

## 2023-12-02 DIAGNOSIS — M545 Low back pain, unspecified: Secondary | ICD-10-CM

## 2024-01-13 ENCOUNTER — Other Ambulatory Visit: Payer: Self-pay | Admitting: Physician Assistant

## 2024-01-13 DIAGNOSIS — M545 Low back pain, unspecified: Secondary | ICD-10-CM

## 2024-01-13 DIAGNOSIS — M79645 Pain in left finger(s): Secondary | ICD-10-CM

## 2024-01-13 DIAGNOSIS — M79642 Pain in left hand: Secondary | ICD-10-CM

## 2024-01-29 ENCOUNTER — Other Ambulatory Visit: Payer: Self-pay | Admitting: Physician Assistant

## 2024-01-29 DIAGNOSIS — J3089 Other allergic rhinitis: Secondary | ICD-10-CM

## 2024-01-29 DIAGNOSIS — R051 Acute cough: Secondary | ICD-10-CM
